# Patient Record
Sex: Female | Born: 1980 | Hispanic: No | Marital: Married | Smoking: Former smoker
Health system: Southern US, Community
[De-identification: ages and names within clinical notes are randomized; demographics above are authoritative.]

## PROBLEM LIST (undated history)

## (undated) DIAGNOSIS — T7840XA Allergy, unspecified, initial encounter: Secondary | ICD-10-CM

## (undated) DIAGNOSIS — E0590 Thyrotoxicosis, unspecified without thyrotoxic crisis or storm: Secondary | ICD-10-CM

## (undated) DIAGNOSIS — E079 Disorder of thyroid, unspecified: Secondary | ICD-10-CM

## (undated) HISTORY — DX: Allergy, unspecified, initial encounter: T78.40XA

## (undated) HISTORY — PX: TUBAL LIGATION: SHX77

## (undated) HISTORY — DX: Disorder of thyroid, unspecified: E07.9

---

## 2018-03-22 ENCOUNTER — Encounter: Payer: Self-pay | Admitting: Family Medicine

## 2018-03-22 ENCOUNTER — Ambulatory Visit (INDEPENDENT_AMBULATORY_CARE_PROVIDER_SITE_OTHER): Payer: Medicaid Other | Admitting: Family Medicine

## 2018-03-22 VITALS — BP 122/83 | HR 89 | Resp 17 | Ht 61.0 in | Wt 196.0 lb

## 2018-03-22 DIAGNOSIS — G43109 Migraine with aura, not intractable, without status migrainosus: Secondary | ICD-10-CM | POA: Diagnosis not present

## 2018-03-22 DIAGNOSIS — Z01818 Encounter for other preprocedural examination: Secondary | ICD-10-CM

## 2018-03-22 DIAGNOSIS — Z7689 Persons encountering health services in other specified circumstances: Secondary | ICD-10-CM

## 2018-03-22 DIAGNOSIS — Z9889 Other specified postprocedural states: Secondary | ICD-10-CM | POA: Diagnosis not present

## 2018-03-22 DIAGNOSIS — H9211 Otorrhea, right ear: Secondary | ICD-10-CM | POA: Diagnosis not present

## 2018-03-22 DIAGNOSIS — H9191 Unspecified hearing loss, right ear: Secondary | ICD-10-CM | POA: Diagnosis not present

## 2018-03-22 DIAGNOSIS — J302 Other seasonal allergic rhinitis: Secondary | ICD-10-CM

## 2018-03-22 MED ORDER — MONTELUKAST SODIUM 10 MG PO TABS: 10.0000 mg | ORAL_TABLET | Freq: Every day | ORAL | 3 refills | Status: DC

## 2018-03-22 MED ORDER — BUTALBITAL-APAP-CAFFEINE 50-325-40 MG PO TABS: 1.0000 | ORAL_TABLET | Freq: Four times a day (QID) | ORAL | 1 refills | Status: DC | PRN

## 2018-03-22 MED ORDER — FLUTICASONE PROPIONATE 50 MCG/ACT NA SUSP: 2.0000 | Freq: Every day | NASAL | 6 refills | Status: DC

## 2018-03-22 MED ORDER — IPRATROPIUM BROMIDE 0.03 % NA SOLN: 2.0000 | Freq: Two times a day (BID) | NASAL | 0 refills | Status: DC

## 2018-03-22 NOTE — Patient Instructions (Signed)
[facility]!    Penelope CoopLatana Belue was seen by Joaquin CourtsKimberly Harris, FNP today.   Tedd SiasLatana Lackie's primary care provider is Bing NeighborsHarris, Kimberly S, FNP.   For the best care possible, you should try to see Joaquin CourtsKimberly Harris, FNP-C whenever you come to the clinic.   We look forward to seeing you again soon!  If you have any questions about your visit today, please call us at [phone] or feel free to reach your primary care provider via MyChart.

## 2018-03-22 NOTE — Progress Notes (Signed)
Sandy Berger, is a 37 y.o. female  ZOX:[id number]  [id number]:[id number]  DOB - Jun 27, 1980  CC:  Chief Complaint  Patient presents with  . Establish Care  . Allergic Rhinitis     has been taking OTC medications with no relief.       HPI: Channie is a 37 y.o. female is here today to establish care.   Wilhemenia Camba does not have a problem list on file.    Today's visit:  Jolanda Mccann recently relocated to [facility]. She is status post delivery of healthy son in October 2018. She reports a concern as she feels the tubal ligation she requested was not performed. She is uncertain if tubal ligation was completed or if her "tubes were removed".  She reports that she was "knocked out"  and is uncertain of what procedure was performed. She has not had any medical follow-up since that time due to loss of medicaid .  Reports regular normal periods. No pain with intercourse or chronic lower pelvic pain.   Allergies/ENT Referral History of chronic allergies. Previously prescribed allergies medications, uncertain of which medications. At one point, she has received allergy injection. No history of asthma. She has had chronic ear problems consisting of chronic draining, ear congestion, and tenderness. She also endorse poor hearing quality.    Headaches  Chronic migraines for years. No prior medication.Number of headaches per week 10 and at least daily. She takes 4 ibuprofen reduces the headache although doesn't completely take headaches. Denies associated visual disturbances. Endorses an aura in which she can differentiate regular mild headaches from that of a an migraine type headache. Endorse nausea at time when headaches are severe. Chronic headaches present since later adolescence. She is not currently prescribed any chronic headache management medications.    Patient denies chest pain, abdominal pain, nausea, new weakness , numbness or tingling, SOB, edema, or worrisome cough.    Current  medications: Current Outpatient Medications:  .  butalbital-acetaminophen-caffeine (FIORICET, ESGIC) 50-325-40 MG tablet, Take 1 tablet by mouth every 6 (six) hours as needed for headache or migraine., Disp: 60 tablet, Rfl: 1 .  fluticasone (FLONASE) 50 MCG/ACT nasal spray, Place 2 sprays into both nostrils daily., Disp: 16 g, Rfl: 6 .  ipratropium (ATROVENT) 0.03 % nasal spray, Place 2 sprays into both nostrils 2 (two) times daily., Disp: 30 mL, Rfl: 0 .  montelukast (SINGULAIR) 10 MG tablet, Take 1 tablet (10 mg total) by mouth at bedtime., Disp: 30 tablet, Rfl: 3   Pertinent family medical history: family history includes Hyperlipidemia in her mother; Hypertension in her paternal uncle.   No Known Allergies  Social History   Socioeconomic History  . Marital status: Married    Spouse name: Not on file  . Number of children: Not on file  . Years of education: Not on file  . Highest education level: Not on file  Occupational History  . Not on file  Social Needs  . Financial resource strain: Not on file  . Food insecurity:    Worry: Not on file    Inability: Not on file  . Transportation needs:    Medical: Not on file    Non-medical: Not on file  Tobacco Use  . Smoking status: Former Games developer  . Smokeless tobacco: Never Used  Substance and Sexual Activity  . Alcohol use: Yes  . Drug use: Never  . Sexual activity: Not on file  Lifestyle  . Physical activity:    Days per week: Not on  file    Minutes per session: Not on file  . Stress: Not on file  Relationships  . Social connections:    Talks on phone: Not on file    Gets together: Not on file    Attends religious service: Not on file    Active member of club or organization: Not on file    Attends meetings of clubs or organizations: Not on file    Relationship status: Not on file  . Intimate partner violence:    Fear of current or ex partner: Not on file    Emotionally abused: Not on file    Physically abused: Not on  file    Forced sexual activity: Not on file  Other Topics Concern  . Not on file  Social History Narrative  . Not on file    Review of Systems: Pertinent negatives listed in HPI Objective:   Vitals:   03/22/18 1453  BP: 122/83  Pulse: 89  Resp: 17  SpO2: 98%    BP Readings from Last 3 Encounters:  03/22/18 122/83    Filed Weights   03/22/18 1453  Weight: 196 lb (88.9 kg)      Physical Exam: Constitutional: Patient appears well-developed and well-nourished. No distress. HENT: Normocephalic, atraumatic, External right and left ear normal. Oropharynx is clear and moist.  Eyes: Conjunctivae and EOM are normal. PERRLA, no scleral icterus. Neck: Normal ROM. Neck supple. No JVD. No tracheal deviation. No thyromegaly. CVS: RRR, S1/S2 +, no murmurs, no gallops, no carotid bruit.  Pulmonary: Effort and breath sounds normal, no stridor, rhonchi, wheezes, rales.  Abdominal: Soft. BS +, no distension, tenderness, rebound or guarding.  Musculoskeletal: Normal range of motion. No edema and no tenderness.  Neuro: Alert. Normal muscle tone coordination. Normal gait. BUE and BLE strength 5/5. Bilateral hand grips symmetrical. No cranial nerve deficit. Skin: Skin is warm and dry. No rash noted. Not diaphoretic. No erythema. No pallor. Psychiatric: Normal mood and affect. Behavior, judgment, thought content normal.     Assessment and plan:  1. Encounter to establish care 2. Drainage from right ear - Ambulatory referral to ENT  3. Hx of external ear surgery - Ambulatory referral to ENT  4. Hearing loss of right ear, unspecified hearing loss type - Ambulatory referral to ENT  5. Tubal ligation evaluation -ordered Non-OB pelvic and transvaginal to evaluate  status  6. Migraine with aura and without status migrainosus, not intractable -trial Fioricet every 6 hours as needed at the initial onset of headache -will consider triptan therapy if HA remain uncontrolled    7. Seasonal  allergies Trial Singulair once daily  Atrovent nasal spray BID PRN nasal congestion Flonase nasal spray once daily     Orders Placed This Encounter  Procedures  . Ambulatory referral to ENT    Referral Priority:   Routine    Referral Type:   Consultation    Referral Reason:   Specialty Services Required    Requested Specialty:   Otolaryngology    Number of Visits Requested:   1    Meds ordered this encounter  Medications  . montelukast (SINGULAIR) 10 MG tablet    Sig: Take 1 tablet (10 mg total) by mouth at bedtime.    Dispense:  30 tablet    Refill:  3  . ipratropium (ATROVENT) 0.03 % nasal spray    Sig: Place 2 sprays into both nostrils 2 (two) times daily.    Dispense:  30 mL    Refill:  0  . fluticasone (FLONASE) 50 MCG/ACT nasal spray    Sig: Place 2 sprays into both nostrils daily.    Dispense:  16 g    Refill:  6  . DISCONTD: butalbital-acetaminophen-caffeine (FIORICET, ESGIC) 50-325-40 MG tablet    Sig: Take 1 tablet by mouth every 6 (six) hours as needed for headache or migraine.    Dispense:  60 tablet    Refill:  1  . DISCONTD: butalbital-acetaminophen-caffeine (FIORICET, ESGIC) 50-325-40 MG tablet    Sig: Take 1 tablet by mouth every 6 (six) hours as needed for headache or migraine.    Dispense:  60 tablet    Refill:  1  . butalbital-acetaminophen-caffeine (FIORICET, ESGIC) 50-325-40 MG tablet    Sig: Take 1 tablet by mouth every 6 (six) hours as needed for headache or migraine.    Dispense:  60 tablet    Refill:  1    The patient was given clear instructions to go to ER or return to medical center if symptoms don't improve, worsen or new problems develop. The patient verbalized understanding. The patient was advised  to call and obtain lab results if they haven't heard anything from out office within 7-10 business days.  Joaquin CourtsKimberly Timber Marshman, FNP [facility] [address], [place] [id number] [phone]: [phone]     This note has been created with Dragon speech recognition software and Paediatric nursesmart phrase technology. Any transcriptional errors are unintentional.

## 2018-04-02 ENCOUNTER — Other Ambulatory Visit: Payer: Self-pay | Admitting: Family Medicine

## 2018-04-08 ENCOUNTER — Telehealth: Payer: Self-pay | Admitting: Family Medicine

## 2018-04-08 ENCOUNTER — Ambulatory Visit (HOSPITAL_COMMUNITY): Admission: RE | Admit: 2018-04-08 | Payer: Medicaid Other | Source: Ambulatory Visit

## 2018-04-08 NOTE — Telephone Encounter (Signed)
Advise after speaking with ultrasound tech, the ultrasound will not provide her with the information she was requesting regarding tubal and lower abdomen symptoms she was experiencing. I think a better idea is for her to see an GYN to determine if another procedure is indicate. Please advise if she would like referral. US has been cancelled.

## 2018-04-08 NOTE — Addendum Note (Signed)
Addended by: Bing Neighbors on: 04/08/2018 12:24 PM   Modules accepted: Orders

## 2018-04-08 NOTE — Telephone Encounter (Signed)
Patient returned call about ultrasound. She would like to proceed with OBGYN referral.

## 2018-04-14 ENCOUNTER — Ambulatory Visit (INDEPENDENT_AMBULATORY_CARE_PROVIDER_SITE_OTHER): Payer: Medicaid Other | Admitting: Family Medicine

## 2018-04-14 ENCOUNTER — Encounter: Payer: Self-pay | Admitting: Family Medicine

## 2018-04-14 VITALS — BP 109/74 | HR 74 | Resp 17 | Ht 61.0 in | Wt 198.4 lb

## 2018-04-14 DIAGNOSIS — Z1389 Encounter for screening for other disorder: Secondary | ICD-10-CM

## 2018-04-14 DIAGNOSIS — Z23 Encounter for immunization: Secondary | ICD-10-CM | POA: Diagnosis not present

## 2018-04-14 DIAGNOSIS — Z13220 Encounter for screening for lipoid disorders: Secondary | ICD-10-CM | POA: Diagnosis not present

## 2018-04-14 DIAGNOSIS — E0590 Thyrotoxicosis, unspecified without thyrotoxic crisis or storm: Secondary | ICD-10-CM | POA: Diagnosis not present

## 2018-04-14 DIAGNOSIS — Z3202 Encounter for pregnancy test, result negative: Secondary | ICD-10-CM

## 2018-04-14 DIAGNOSIS — Z0001 Encounter for general adult medical examination with abnormal findings: Secondary | ICD-10-CM

## 2018-04-14 DIAGNOSIS — IMO0001 Reserved for inherently not codable concepts without codable children: Secondary | ICD-10-CM

## 2018-04-14 DIAGNOSIS — Z0000 Encounter for general adult medical examination without abnormal findings: Secondary | ICD-10-CM

## 2018-04-14 DIAGNOSIS — Z130 Encounter for screening for diseases of the blood and blood-forming organs and certain disorders involving the immune mechanism: Secondary | ICD-10-CM

## 2018-04-14 DIAGNOSIS — Z1329 Encounter for screening for other suspected endocrine disorder: Secondary | ICD-10-CM | POA: Diagnosis not present

## 2018-04-14 DIAGNOSIS — Z114 Encounter for screening for human immunodeficiency virus [HIV]: Secondary | ICD-10-CM

## 2018-04-14 DIAGNOSIS — Z131 Encounter for screening for diabetes mellitus: Secondary | ICD-10-CM

## 2018-04-14 DIAGNOSIS — Z803 Family history of malignant neoplasm of breast: Secondary | ICD-10-CM

## 2018-04-14 DIAGNOSIS — F438 Other reactions to severe stress: Secondary | ICD-10-CM

## 2018-04-14 LAB — POCT URINALYSIS DIP (CLINITEK)
Bilirubin, UA: NEGATIVE
Glucose, UA: NEGATIVE mg/dL
Ketones, POC UA: NEGATIVE mg/dL
Leukocytes, UA: NEGATIVE
Nitrite, UA: NEGATIVE
PH UA: 6.5 (ref 5.0–8.0)
Spec Grav, UA: 1.025 (ref 1.010–1.025)
Urobilinogen, UA: 0.2 E.U./dL

## 2018-04-14 LAB — POCT URINE PREGNANCY: Preg Test, Ur: NEGATIVE

## 2018-04-14 MED ORDER — SUMATRIPTAN SUCCINATE 50 MG PO TABS: 100.0000 mg | ORAL_TABLET | ORAL | 1 refills | Status: DC | PRN

## 2018-04-14 NOTE — Patient Instructions (Addendum)
ENT referral sent to:  [facility] ph. # [phone]   Keeping You Healthy  Get These Tests 1. Blood Pressure- Have your blood pressure checked once a year by your health care provider.  Normal blood pressure is 120/80. 2. Weight- Have your body mass index (BMI) calculated to screen for obesity.  BMI is measure of body fat based on height and weight.  You can also calculate your own BMI at https://www.west-esparza.com/. 3. Cholesterol- Have your cholesterol checked every 5 years starting at age 19 then yearly starting at age 45. 4. Chlamydia, HIV, and other sexually transmitted diseases- Get screened every year until age 47, then within three months of each new sexual provider. 5. Pap Test - Every 1-5 years; discuss with your health care provider. 6. Mammogram- Every 1-2 years starting at age 70--50  Take these medicines  Calcium with Vitamin D-Your body needs 1200 mg of Calcium each day and [phone] IU of Vitamin D daily.  Your body can only absorb 500 mg of Calcium at a time so Calcium must be taken in 2 or 3 divided doses throughout the day.  Multivitamin with folic acid- Once daily if it is possible for you to become pregnant.  Get these Immunizations  Gardasil-Series of three doses; prevents HPV related illness such as genital warts and cervical cancer.  Menactra-Single dose; prevents meningitis.  Tetanus shot- Every 10 years.  Flu shot-Every year.  Take these steps 1. Do not smoke-Your healthcare provider can help you quit.  For tips on how to quit go to www.smokefree.gov or call 1-800 QUITNOW. 2. Be physically active- Exercise 5 days a week for at least 30 minutes.  If you are not already physically active, start slow and gradually work up to 30 minutes of moderate physical activity.  Examples of moderate activity include walking briskly, dancing, swimming, bicycling, etc. 3. Breast Cancer- A self breast exam every month is important for early  detection of breast cancer.  For more information and instruction on self breast exams, ask your healthcare provider or [email or web address].[email or web address]. 4. Eat a healthy diet- Eat a variety of healthy foods such as fruits, vegetables, whole grains, low fat milk, low fat cheeses, yogurt, lean meats, poultry and fish, beans, nuts, tofu, etc.  For more information go to www. Thenutritionsource.org 5. Drink alcohol in moderation- Limit alcohol intake to one drink or less per day. Never drink and drive. 6. Depression- Your emotional health is as important as your physical health.  If you're feeling down or losing interest in things you normally enjoy please talk to your healthcare provider about being screened for depression. 7. Dental visit- Brush and floss your teeth twice daily; visit your dentist twice a year. 8. Eye doctor- Get an eye exam at least every 2 years. 9. Helmet use- Always wear a helmet when riding a bicycle, motorcycle, rollerblading or skateboarding. 10. Safe sex- If you may be exposed to sexually transmitted infections, use a condom. 11. Seat belts- Seat belts can save your live; always wear one. 12. Smoke/Carbon Monoxide detectors- These detectors need to be installed on the appropriate level of your home. Replace batteries at least once a year. 13. Skin cancer- When out in the sun please cover up and use sunscreen 15 SPF or higher. 14. Violence- If anyone is threatening or hurting you, please tell your healthcare provider.

## 2018-04-14 NOTE — Progress Notes (Signed)
Patient ID: Sandy Berger, female    DOB: [birth year], 38 y.o.   MRN: [id number]  PCP: Bing Neighbors, FNP  Chief Complaint  Patient presents with  . Annual Exam    Subjective:  HPI Sandy Berger is a 38 y.o. female, nonsmoker presents for complete physical exam.  Current Body mass index is 37.49 kg/m., inactive of routine physical exercise. She is starting to read food labels more to try and reduce unintentionally gaining weight.  Family history as noted below.  She is concerned today about ongoing headaches.  She is a chronic migraine suffer and notes an increased level of stress recently. Currently residing in the house for 21 other people.  She recently moved from [place] along with all of her family to relocate here to [place].  She is waiting for additional housing.  She is currently unemployed along with her husband who is also unemployed.  She attributes this for the increased frequency of her headaches that was previously well controlled. She is currently taking Fioricet but having to dose it daily and feels she needs something stronger to manage her headaches.  She is also interested in pursuing counseling services as she has some things "I need to work through". She endorses some trouble within her marriage that has been longstanding. Concerns over her children and issues from childhood that she like to discuss. No prior mental health history.    Health Promotion: Health Screening Current/Overdue:   Immunizations- request influenza today PAP- following up with GYN  Mammogram, sister has breast cancer at age 86 year old. Would like a mammogram now. Sister 58 is currently being evaluated for abnormal breast lumps  Colonscopy or Cologuard Last Dental Exam: 1 year ago Last Eye Exam: wears reading, no recent eye exam.  Chronic conditions include: There are no active problems to display for this patient.     Current home medications include: Prior to Admission  medications   Medication Sig Start Date End Date Taking? Authorizing Provider  butalbital-acetaminophen-caffeine (FIORICET, ESGIC) 50-325-40 MG tablet Take 1 tablet by mouth every 6 (six) hours as needed for headache or migraine. 03/22/18 03/22/19 Yes Bing Neighbors, FNP  fluticasone (FLONASE) 50 MCG/ACT nasal spray Place 2 sprays into both nostrils daily. 03/22/18  Yes Bing Neighbors, FNP  ipratropium (ATROVENT) 0.03 % nasal spray Place 2 sprays into both nostrils 2 (two) times daily. 03/22/18  Yes Bing Neighbors, FNP  montelukast (SINGULAIR) 10 MG tablet Take 1 tablet (10 mg total) by mouth at bedtime. 03/22/18  Yes Bing Neighbors, FNP    Family History  Problem Relation Age of Onset  . Hyperlipidemia Mother   . Hypertension Paternal Uncle   . Diabetes Neg Hx   . Stroke Neg Hx      No Known Allergies  Social History   Socioeconomic History  . Marital status: Married    Spouse name: Not on file  . Number of children: Not on file  . Years of education: Not on file  . Highest education level: Not on file  Occupational History  . Not on file  Social Needs  . Financial resource strain: Not on file  . Food insecurity:    Worry: Not on file    Inability: Not on file  . Transportation needs:    Medical: Not on file    Non-medical: Not on file  Tobacco Use  . Smoking status: Former Games developer  . Smokeless tobacco: Never Used  Substance and Sexual  Activity  . Alcohol use: Yes  . Drug use: Never  . Sexual activity: Not on file  Lifestyle  . Physical activity:    Days per week: Not on file    Minutes per session: Not on file  . Stress: Not on file  Relationships  . Social connections:    Talks on phone: Not on file    Gets together: Not on file    Attends religious service: Not on file    Active member of club or organization: Not on file    Attends meetings of clubs or organizations: Not on file    Relationship status: Not on file  . Intimate partner  violence:    Fear of current or ex partner: Not on file    Emotionally abused: Not on file    Physically abused: Not on file    Forced sexual activity: Not on file  Other Topics Concern  . Not on file  Social History Narrative  . Not on file   Review of Systems Pertinent negatives listed in HPI Past Medical, Surgical Family and Social History reviewed and updated.  Objective:   Today's Vitals   04/14/18 1047  BP: 109/74  Pulse: 74  Resp: 17  SpO2: 96%  Weight: 198 lb 6.4 oz (90 kg)  Height: 5\' 1"  (1.549 m)    Wt Readings from Last 3 Encounters:  04/14/18 198 lb 6.4 oz (90 kg)  03/22/18 196 lb (88.9 kg)   Physical Exam Physical Exam: Constitutional: Patient appears well-developed and well-nourished. No distress. HENT: Normocephalic, atraumatic, External right and left ear normal. Oropharynx is clear and moist.  Eyes: Conjunctivae and EOM are normal. PERRLA, no scleral icterus. Neck: Normal ROM. Neck supple. No JVD. No tracheal deviation. No thyromegaly. CVS: RRR, S1/S2 +, no murmurs, no gallops, no carotid bruit.  Pulmonary: Effort and breath sounds normal, no stridor, rhonchi, wheezes, rales.  Abdominal: Soft. BS +, no distension, tenderness, rebound or guarding.  Musculoskeletal: Normal range of motion. No edema and no tenderness.  Lymphadenopathy: No lymphadenopathy noted, cervical, inguinal or axillary Neuro: Alert. Normal reflexes, muscle tone coordination. No cranial nerve deficit. Skin: Skin is warm and dry. No rash noted. Not diaphoretic. No erythema. No pallor. Psychiatric: Normal mood and affect. Behavior, judgment, thought content normal. Assessment & Plan:  1. Annual physical exam Age-appropriate anticipatory guidance provided   2. Screening for deficiency anemia - CBC with Differential  3. Lipid screening - Lipid panel  4. Thyroid disorder screen - Thyroid Panel With TSH  5. Diabetes mellitus screening - Comprehensive metabolic panel - Hemoglobin  A1c  6. Screening for blood or protein in urine - POCT urine pregnancy - POCT URINALYSIS DIP (CLINITEK)  7. Stress reaction, mixed disorders -Schedule an appointment with Jenel LucksJasmine Lewis, LCSW   9. Family history of breast cancer - MM Digital Screening; Future

## 2018-04-15 ENCOUNTER — Telehealth: Payer: Self-pay | Admitting: Family Medicine

## 2018-04-15 DIAGNOSIS — R7989 Other specified abnormal findings of blood chemistry: Secondary | ICD-10-CM

## 2018-04-15 LAB — CBC WITH DIFFERENTIAL/PLATELET
BASOS ABS: 0 10*3/uL (ref 0.0–0.2)
Basos: 1 %
EOS (ABSOLUTE): 0.1 10*3/uL (ref 0.0–0.4)
Eos: 3 %
Hematocrit: 39.4 % (ref 34.0–46.6)
Hemoglobin: 13.3 g/dL (ref 11.1–15.9)
Immature Grans (Abs): 0 10*3/uL (ref 0.0–0.1)
Immature Granulocytes: 0 %
Lymphocytes Absolute: 1.8 10*3/uL (ref 0.7–3.1)
Lymphs: 48 %
MCH: 28.3 pg (ref 26.6–33.0)
MCHC: 33.8 g/dL (ref 31.5–35.7)
MCV: 84 fL (ref 79–97)
Monocytes Absolute: 0.4 10*3/uL (ref 0.1–0.9)
Monocytes: 10 %
Neutrophils Absolute: 1.4 10*3/uL (ref 1.4–7.0)
Neutrophils: 38 %
Platelets: 288 10*3/uL (ref 150–450)
RBC: 4.7 x10E6/uL (ref 3.77–5.28)
RDW: 13 % (ref 11.7–15.4)
WBC: 3.7 10*3/uL (ref 3.4–10.8)

## 2018-04-15 LAB — COMPREHENSIVE METABOLIC PANEL
ALT: 12 IU/L (ref 0–32)
AST: 13 IU/L (ref 0–40)
Albumin/Globulin Ratio: 1.2 (ref 1.2–2.2)
Albumin: 4.2 g/dL (ref 3.8–4.8)
Alkaline Phosphatase: 113 IU/L (ref 39–117)
BUN/Creatinine Ratio: 15 (ref 9–23)
BUN: 10 mg/dL (ref 6–20)
Bilirubin Total: 0.3 mg/dL (ref 0.0–1.2)
CHLORIDE: 103 mmol/L (ref 96–106)
CO2: 21 mmol/L (ref 20–29)
Calcium: 9.2 mg/dL (ref 8.7–10.2)
Creatinine, Ser: 0.65 mg/dL (ref 0.57–1.00)
GFR calc Af Amer: 131 mL/min/{1.73_m2} (ref >59)
GFR calc non Af Amer: 114 mL/min/{1.73_m2} (ref >59)
Globulin, Total: 3.5 g/dL (ref 1.5–4.5)
Glucose: 91 mg/dL (ref 65–99)
Potassium: 4.2 mmol/L (ref 3.5–5.2)
Sodium: 139 mmol/L (ref 134–144)
Total Protein: 7.7 g/dL (ref 6.0–8.5)

## 2018-04-15 LAB — LIPID PANEL
CHOL/HDL RATIO: 3.9 ratio (ref 0.0–4.4)
Cholesterol, Total: 171 mg/dL (ref 100–199)
HDL: 44 mg/dL (ref >39)
LDL Calculated: 86 mg/dL (ref 0–99)
TRIGLYCERIDES: 203 mg/dL — AB (ref 0–149)
VLDL Cholesterol Cal: 41 mg/dL — ABNORMAL HIGH (ref 5–40)

## 2018-04-15 LAB — THYROID PANEL WITH TSH
Free Thyroxine Index: 1.9 (ref 1.2–4.9)
T3 Uptake Ratio: 24 % (ref 24–39)
T4, Total: 7.8 ug/dL (ref 4.5–12.0)

## 2018-04-15 LAB — HEMOGLOBIN A1C
Est. average glucose Bld gHb Est-mCnc: 105 mg/dL
Hgb A1c MFr Bld: 5.3 % (ref 4.8–5.6)

## 2018-04-15 NOTE — Telephone Encounter (Signed)
Patients recent labs were significant for an overactive thyroid. Other labs were within normal range. I am referring patient to endocrinology for further evaluation.

## 2018-04-16 NOTE — Telephone Encounter (Signed)
Patient notified of lab results & recommendations. Expressed understanding. Aware that she will be contacted by Endocrinology with an appointment.

## 2018-04-16 NOTE — Addendum Note (Signed)
Addended by: Bing Neighbors on: 04/16/2018 09:15 PM   Modules accepted: Orders

## 2018-04-20 ENCOUNTER — Ambulatory Visit (INDEPENDENT_AMBULATORY_CARE_PROVIDER_SITE_OTHER): Payer: Medicaid Other | Admitting: Licensed Clinical Social Worker

## 2018-04-20 DIAGNOSIS — F439 Reaction to severe stress, unspecified: Secondary | ICD-10-CM

## 2018-04-21 NOTE — BH Specialist Note (Signed)
Integrated Behavioral Health Initial Visit  MRN: [id number] Name: Sandy Berger  Number of Integrated Behavioral Health Clinician visits:: 1/6 Session Start time: 3:30 PM  Session End time: 4:30 PM Total time: 1 hour  Type of Service: Integrated Behavioral Health- Individual/Family Interpretor:No. Interpretor Name and Language: N/A   SUBJECTIVE: Sandy Berger is a 38 y.o. female accompanied by self Patient was referred by FNP Tiburcio Pea for stress. Patient reports the following symptoms/concerns: Pt recently relocated to [place] from [place] with her family of seven. They are currently residing with at her sister's residence. Pt reports triggers including marriage, financial strain, and worry about her minor children. Duration of problem: Ongoing; Severity of problem: moderate  OBJECTIVE: Mood: Anxious and Affect: Appropriate Risk of harm to self or others: No plan to harm self or others  LIFE CONTEXT: Family and Social: Pt receives support from family School/Work: Pt will be starting a full time position in the next week. She has medicaid Self-Care: Pt is open to initiating psychotherapy to learn healthy coping skills Life Changes: Pt recently relocated to [place] from [place] with her family of seven. They are currently residing with at her sister's residence. Pt reports triggers including marriage, financial strain, and worry about her minor children.  GOALS ADDRESSED: Patient will: 1. Reduce symptoms of: anxiety and stress 2. Increase knowledge and/or ability of: coping skills  3. Demonstrate ability to: Increase healthy adjustment to current life circumstances and Increase adequate support systems for patient/family  INTERVENTIONS: Interventions utilized: Mindfulness or Management consultant, Supportive Counseling, Psychoeducation and/or Health Education and Link to [employer]  Standardized Assessments completed: GAD-7 and PHQ 2&9  ASSESSMENT: Patient  currently experiencing an increase in stress. Pt recently relocated to [place] from [place] with her family of seven. They are currently residing with at her sister's residence. Pt reports triggers including marriage, financial strain, and worry about her minor children.   Patient may benefit from psychotherapy, which patient is open to initiating. Patient has good insight on how her psychosocial stressors have negatively impacted her mental and physical health. Healthy coping skills were discussed to assist with decreasing and/or managing symptoms. Behavioral health resources that coincide with pt's work schedule were mailed out to patient's residence.   PLAN: 1. Follow up with behavioral health clinician on : Pt was encouraged to contact [facility] if symptoms worsen or fail to improve to schedule behavioral appointments at [facility]. 2. Behavioral recommendations: LCSWA recommends that pt apply healthy coping skills discussed and utilize provided resources. Pt is encouraged to schedule follow up appointment with [facility]WA 3. Referral(s): Integrated Art gallery manager (In Clinic) and Community Mental Health Services (LME/Outside Clinic) 4. "From scale of 1-10, how likely are you to follow plan?":   Bridgett Larsson, LCSW 04/23/2018 10:47 AM

## 2018-04-27 DIAGNOSIS — E0590 Thyrotoxicosis, unspecified without thyrotoxic crisis or storm: Secondary | ICD-10-CM | POA: Diagnosis not present

## 2018-06-04 ENCOUNTER — Other Ambulatory Visit: Payer: Self-pay

## 2018-06-04 ENCOUNTER — Ambulatory Visit
Admission: RE | Admit: 2018-06-04 | Discharge: 2018-06-04 | Disposition: A | Discharge status: Home / Self Care | Payer: Medicaid Other | Source: Ambulatory Visit | Attending: Family Medicine | Admitting: Family Medicine

## 2018-06-04 DIAGNOSIS — Z803 Family history of malignant neoplasm of breast: Secondary | ICD-10-CM

## 2018-06-04 DIAGNOSIS — Z1231 Encounter for screening mammogram for malignant neoplasm of breast: Secondary | ICD-10-CM | POA: Diagnosis not present

## 2018-06-09 ENCOUNTER — Ambulatory Visit (INDEPENDENT_AMBULATORY_CARE_PROVIDER_SITE_OTHER): Payer: Medicaid Other | Admitting: Family Medicine

## 2018-06-09 ENCOUNTER — Other Ambulatory Visit (HOSPITAL_COMMUNITY)
Admission: RE | Admit: 2018-06-09 | Discharge: 2018-06-09 | Disposition: A | Discharge status: Home / Self Care | Payer: Medicaid Other | Source: Ambulatory Visit | Attending: Family Medicine | Admitting: Family Medicine

## 2018-06-09 ENCOUNTER — Other Ambulatory Visit: Payer: Self-pay

## 2018-06-09 ENCOUNTER — Encounter: Payer: Self-pay | Admitting: Family Medicine

## 2018-06-09 VITALS — BP 113/72 | HR 82 | Temp 98.4°F | Resp 17 | Ht 61.0 in | Wt 204.8 lb

## 2018-06-09 DIAGNOSIS — R51 Headache: Secondary | ICD-10-CM | POA: Diagnosis not present

## 2018-06-09 DIAGNOSIS — N898 Other specified noninflammatory disorders of vagina: Secondary | ICD-10-CM | POA: Diagnosis not present

## 2018-06-09 DIAGNOSIS — Z7689 Persons encountering health services in other specified circumstances: Secondary | ICD-10-CM

## 2018-06-09 DIAGNOSIS — R519 Headache, unspecified: Secondary | ICD-10-CM

## 2018-06-09 MED ORDER — CETIRIZINE HCL 10 MG PO TABS: 10.0000 mg | ORAL_TABLET | Freq: Every day | ORAL | 11 refills | Status: DC

## 2018-06-09 MED ORDER — PSEUDOEPHEDRINE HCL 60 MG PO TABS: 60.0000 mg | ORAL_TABLET | ORAL | 0 refills | Status: DC | PRN

## 2018-06-09 MED ORDER — PSEUDOEPHEDRINE HCL 60 MG PO TABS: 60.0000 mg | ORAL_TABLET | Freq: Three times a day (TID) | ORAL | 0 refills | Status: DC | PRN

## 2018-06-09 MED ORDER — METRONIDAZOLE 500 MG PO TABS: 500.0000 mg | ORAL_TABLET | Freq: Two times a day (BID) | ORAL | 0 refills | Status: DC

## 2018-06-09 NOTE — Patient Instructions (Addendum)
[facility] ph. # [id number] address [address]  Call to schedule an appointment.  I have referred you to OB/GYN for complete Pap smear.  The ultrasound will not verify your tubal ligation and another test would need to be performed.  I recommend calling and requesting records from the hospital in which you delivered your son and requested the procedure.    Allergies, Adult An allergy means that your body reacts to something that bothers it (allergen). It is not a normal reaction. This can happen from something that you:  Eat.  Breathe in.  Touch. You can have an allergy (be allergic) to:  Outdoor things, like: ? Pollen. ? Grass. ? Weeds.  Indoor things, like: ? Dust. ? Smoke. ? Pet dander.  Foods.  Medicines.  Things that bother your skin, like: ? Detergents. ? Chemicals. ? Latex.  Perfume.  Bugs. An allergy cannot spread from person to person (is not contagious). Follow these instructions at home:         Stay away from things that you know you are allergic to.  If you have allergies to things in the air, wash out your nose each day. Do it with one of these: ? A salt-water (saline) spray. ? A container (neti pot).  Take over-the-counter and prescription medicines only as told by your doctor.  Keep all follow-up visits as told by your doctor. This is important.  If you are at risk for a very bad allergy reaction (anaphylaxis), keep an auto-injector with you all the time. This is called an epinephrine injection. ? This is pre-measured medicine with a needle. You can put it into your skin by yourself. ? Right after you have a very bad allergy reaction, you or a person with you must give the medicine in less than a few minutes. This is an emergency.  If you have ever had a very bad allergy reaction, wear a medical alert bracelet or necklace. Your very bad allergy should be written on it. Contact a health care  provider if:  Your symptoms do not get better with treatment. Get help right away if:  You have symptoms of a very bad allergy reaction. These include: ? A swollen mouth, tongue, or throat. ? Pain or tightness in your chest. ? Trouble breathing. ? Being short of breath. ? Dizziness. ? Fainting. ? Very bad pain in your belly (abdomen). ? Throwing up (vomiting). ? Watery poop (diarrhea). Summary  An allergy means that your body reacts to something that bothers it (allergen). It is not a normal reaction.  Stay away from things that make your body react.  Take over-the-counter and prescription medicines only as told by your doctor.  If you are at risk for a very bad allergy reaction, carry an auto-injector (epinephrine injection) all the time. Also, wear a medical alert bracelet or necklace so people know about your allergy. This information is not intended to replace advice given to you by your health care provider. Make sure you discuss any questions you have with your health care provider. Document Released: 07/05/2012 Document Revised: 06/23/2016 Document Reviewed: 06/23/2016 Elsevier Interactive Patient Education  2019 [employer].

## 2018-06-09 NOTE — Progress Notes (Deleted)
Regular headache have improved,

## 2018-06-09 NOTE — Progress Notes (Signed)
Patient ID: Sandy Berger, female    DOB: April 08, 1980, 38 y.o.   MRN: [id number]  PCP: Bing Neighbors, FNP  Chief Complaint  Patient presents with  . Headache    Subjective:  HPI  Sandy Berger is a 38 y.o. female presents for a headache follow-up. Headaches have improved.  Patient has had only approximately 3 headaches since she was last seen in office.  With one headache she did attempt to take the Imitrex and had severe drowsiness therefore has not taken the medication again. The Fioricet has helped eliminate other headaches. She has a complaint today of left-sided facial pressure and pain. She endorses allergies. She attempted to use Atrovent and Flonase which were prescribed however reports the next day she felt worse. She has not taken any over-the-counter antihistamines for her symptoms. She reports the regular headache medicine does not improve the facial pressure symptoms. Patient was also recently diagnosed with hyperthyroidism.  She recently started tapazole and reports this is caused her energy level to go down.  She reports prior to start medication she had excessive amount of energy and feels that the medication is somewhat slowed her down. She is  followed by endocrinologist  Social History   Socioeconomic History  . Marital status: Married    Spouse name: Not on file  . Number of children: Not on file  . Years of education: Not on file  . Highest education level: Not on file  Occupational History  . Not on file  Social Needs  . Financial resource strain: Not on file  . Food insecurity:    Worry: Not on file    Inability: Not on file  . Transportation needs:    Medical: Not on file    Non-medical: Not on file  Tobacco Use  . Smoking status: Former Games developer  . Smokeless tobacco: Never Used  Substance and Sexual Activity  . Alcohol use: Yes  . Drug use: Never  . Sexual activity: Not on file  Lifestyle  . Physical activity:    Days per week: Not on  file    Minutes per session: Not on file  . Stress: Not on file  Relationships  . Social connections:    Talks on phone: Not on file    Gets together: Not on file    Attends religious service: Not on file    Active member of club or organization: Not on file    Attends meetings of clubs or organizations: Not on file    Relationship status: Not on file  . Intimate partner violence:    Fear of current or ex partner: Not on file    Emotionally abused: Not on file    Physically abused: Not on file    Forced sexual activity: Not on file  Other Topics Concern  . Not on file  Social History Narrative  . Not on file    Family History  Problem Relation Age of Onset  . Hyperlipidemia Mother   . Hypertension Paternal Uncle   . Breast cancer Sister   . Diabetes Neg Hx   . Stroke Neg Hx      Review of Systems Pertinent negatives listed in HPI No Known Allergies  Prior to Admission medications   Medication Sig Start Date End Date Taking? Authorizing Provider  butalbital-acetaminophen-caffeine (FIORICET, ESGIC) 50-325-40 MG tablet Take 1 tablet by mouth every 6 (six) hours as needed for headache or migraine. 03/22/18 03/22/19 Yes Bing Neighbors, FNP  fluticasone (  FLONASE) 50 MCG/ACT nasal spray Place 2 sprays into both nostrils daily. 03/22/18  Yes Bing Neighbors, FNP  ipratropium (ATROVENT) 0.03 % nasal spray Place 2 sprays into both nostrils 2 (two) times daily. 03/22/18  Yes Bing Neighbors, FNP  methimazole (TAPAZOLE) 5 MG tablet Take 5 mg by mouth daily. 05/27/18  Yes [provider]  montelukast (SINGULAIR) 10 MG tablet Take 1 tablet (10 mg total) by mouth at bedtime. 03/22/18  Yes Bing Neighbors, FNP  SUMAtriptan (IMITREX) 50 MG tablet Take 2 tablets (100 mg total) by mouth every 2 (two) hours as needed for migraine. May repeat in 2 hours if headache persists or recurs. 04/14/18  Yes Bing Neighbors, FNP    Past Medical, Surgical Family and Social History  reviewed and updated.    Objective:   Today's Vitals   06/09/18 1031  BP: 113/72  Pulse: 82  Resp: 17  Temp: 98.4 F (36.9 C)  TempSrc: Oral  SpO2: 97%  Weight: 204 lb 12.8 oz (92.9 kg)  Height: 5\' 1"  (1.549 m)    BP Readings from Last 3 Encounters:  06/09/18 113/72  04/14/18 109/74  03/22/18 122/83    Filed Weights   06/09/18 1031  Weight: 204 lb 12.8 oz (92.9 kg)       Physical Exam Constitutional:      Appearance: Normal appearance.  HENT:     Head: Normocephalic.     Nose: Nasal tenderness, mucosal edema, congestion and rhinorrhea present.     Left Sinus: Maxillary sinus tenderness present.  Neck:     Musculoskeletal: Normal range of motion and neck supple.  Cardiovascular:     Rate and Rhythm: Normal rate and regular rhythm.  Musculoskeletal: Normal range of motion.  Lymphadenopathy:     Cervical: No cervical adenopathy.  Skin:    General: Skin is warm and dry.  Neurological:     General: No focal deficit present.     Mental Status: She is alert.      No results found for: POCGLU  Lab Results  Component Value Date   HGBA1C 5.3 04/14/2018       Assessment & Plan:  1. Left-sided headache, secondary nasal congestion Will trial pseudoephedrine 60 mg every 8 hours as needed Continue Cetrizine and Singulair Continue Flonase only   2. Referral of patient - Ambulatory referral to Obstetrics / Gynecology ,Gynecological exam   3. Vaginal discharge, hx of BV Will start metronidazole 500 mg BID while vaginal specimen is pending.  - Cervicovaginal ancillary only, pending   Joaquin Courts, FNP Primary Care at [facility] [address] [place] [address] [phone]: [phone]

## 2018-06-10 LAB — CERVICOVAGINAL ANCILLARY ONLY
Bacterial vaginitis: POSITIVE — AB
CHLAMYDIA, DNA PROBE: NEGATIVE
Candida vaginitis: NEGATIVE
NEISSERIA GONORRHEA: NEGATIVE
Trichomonas: NEGATIVE

## 2018-07-12 DIAGNOSIS — E0590 Thyrotoxicosis, unspecified without thyrotoxic crisis or storm: Secondary | ICD-10-CM | POA: Diagnosis not present

## 2018-07-19 DIAGNOSIS — E0500 Thyrotoxicosis with diffuse goiter without thyrotoxic crisis or storm: Secondary | ICD-10-CM | POA: Diagnosis not present

## 2018-07-26 ENCOUNTER — Ambulatory Visit (INDEPENDENT_AMBULATORY_CARE_PROVIDER_SITE_OTHER): Payer: Medicaid Other | Admitting: Family Medicine

## 2018-07-26 ENCOUNTER — Other Ambulatory Visit: Payer: Self-pay

## 2018-07-26 DIAGNOSIS — R51 Headache: Secondary | ICD-10-CM | POA: Diagnosis not present

## 2018-07-26 DIAGNOSIS — K089 Disorder of teeth and supporting structures, unspecified: Secondary | ICD-10-CM | POA: Diagnosis not present

## 2018-07-26 DIAGNOSIS — R519 Headache, unspecified: Secondary | ICD-10-CM

## 2018-07-26 MED ORDER — NAPROXEN 500 MG PO TABS: 500.0000 mg | ORAL_TABLET | Freq: Two times a day (BID) | ORAL | 0 refills | Status: DC

## 2018-07-26 MED ORDER — AMOXICILLIN 500 MG PO CAPS: 500.0000 mg | ORAL_CAPSULE | Freq: Two times a day (BID) | ORAL | 0 refills | Status: DC

## 2018-07-26 NOTE — Progress Notes (Signed)
Virtual Visit via Telephone Note  I connected with Sandy Berger on 07/26/18 at  3:50 PM EDT by telephone and verified that I am speaking with the correct person using two identifiers.  Location: Patient: Patient is located at home during today's encounter Provider: Located at primary care office    I discussed the limitations, risks, security and privacy concerns of performing an evaluation and management service by telephone and the availability of in person appointments. I also discussed with the patient that there may be a patient responsible charge related to this service. The patient expressed understanding and agreed to proceed.   History of Present Illness: Concern for possible dental infection. Experiencing right maxillary facial pain. She has tooth on the right side needing to be extracted. Due to COVID-19 restrictions, dental office is currently closed. Pain is radiating in the right ear. No erythema or noticeable facial swelling of the right-side of face.   No inner ear pain, pressure, popping, or drainage. No recent sinus or nasal symptoms. She has taking multiple doses of 800 mg ibuprofen and Tylenol without improvement of pain. No fever, chills, nausea, or vomiting.   Assessment and Plan: 1. Right sided facial pain 2. Poor dentition Suspect dental problem as the source of facial tenderness given recent evaluation by dentist recommended tooth extraction. Will cover with Amoxicillin 500 mg BID x 10 days.  Follow Up Instructions: RTC if symptoms worsen or do not improve.   I discussed the assessment and treatment plan with the patient. The patient was provided an opportunity to ask questions and all were answered. The patient agreed with the plan and demonstrated an understanding of the instructions.   The patient was advised to call back or seek an in-person evaluation if the symptoms worsen or if the condition fails to improve as anticipated.  I provided 15 minutes of  non-face-to-face time during this encounter.   Joaquin Courts, FNP

## 2018-07-26 NOTE — Progress Notes (Signed)
Called patient to initiate their telephone visit with provider Joaquin Courts, FNP-C. Verified date of birth. Patient has c/o of R sided dental pain x 2 weeks. Has been alternating Tylenol & Ibuprofen with mild relief. KWalker, CMA.

## 2018-07-30 ENCOUNTER — Ambulatory Visit: Payer: Medicaid Other | Admitting: Family Medicine

## 2018-09-29 ENCOUNTER — Telehealth: Payer: Self-pay | Admitting: Obstetrics & Gynecology

## 2018-09-29 NOTE — Telephone Encounter (Signed)
Called the patient to inform of upcoming appointment. Left a detailed voicemail of the location, appointment time and date. Also informed the patient if they’ve been in close contact with someone or diagnosed with Covid19 in the previous 14-day period please call to reschedule. If the patient has also experience flu-like symptoms such as sore throat, fever, and/or shortness of breath. Please call our office to reschedule. In addition, please be sure to wear a face mask to the visit and sanitize hands upon entering the office. Also, no children or visitors or allowed due to Covid19 restrictions. If you have any questions or concerns, please contact our office. °

## 2018-09-30 ENCOUNTER — Encounter: Payer: Self-pay | Admitting: Family Medicine

## 2018-09-30 ENCOUNTER — Other Ambulatory Visit: Payer: Self-pay

## 2018-09-30 ENCOUNTER — Other Ambulatory Visit (HOSPITAL_COMMUNITY)
Admission: RE | Admit: 2018-09-30 | Discharge: 2018-09-30 | Disposition: A | Discharge status: Home / Self Care | Payer: Medicaid Other | Source: Ambulatory Visit | Attending: Family Medicine | Admitting: Family Medicine

## 2018-09-30 ENCOUNTER — Ambulatory Visit (INDEPENDENT_AMBULATORY_CARE_PROVIDER_SITE_OTHER): Payer: Medicaid Other | Admitting: Family Medicine

## 2018-09-30 VITALS — Wt 204.0 lb

## 2018-09-30 DIAGNOSIS — Z01419 Encounter for gynecological examination (general) (routine) without abnormal findings: Secondary | ICD-10-CM | POA: Diagnosis not present

## 2018-09-30 DIAGNOSIS — R102 Pelvic and perineal pain unspecified side: Secondary | ICD-10-CM

## 2018-09-30 DIAGNOSIS — N898 Other specified noninflammatory disorders of vagina: Secondary | ICD-10-CM | POA: Diagnosis not present

## 2018-09-30 DIAGNOSIS — Z124 Encounter for screening for malignant neoplasm of cervix: Secondary | ICD-10-CM | POA: Diagnosis not present

## 2018-09-30 NOTE — Progress Notes (Signed)
[place] scheduled for July 23rd @ 1400.  Pt notified.

## 2018-09-30 NOTE — Progress Notes (Signed)
  Subjective:     Sandy Berger is a 38 y.o. P6 with 2 prior C-sections  female and is here for a comprehensive physical exam. The patient reports problems - pelvic pain. Has had BTL after last delivery. Reports pain and is worse with movement. Cycles are ok, but have more pain. Reports I cannot hold my pee, some urge and some stress incontinence.   The following portions of the patient's history were reviewed and updated as appropriate: allergies, current medications, past family history, past medical history, past social history, past surgical history and problem list.  Review of Systems Pertinent items noted in HPI and remainder of comprehensive ROS otherwise negative.   Objective:    Wt 204 lb (92.5 kg)   LMP 09/01/2018 (Exact Date)   BMI 38.55 kg/m  Head: Normocephalic, without obvious abnormality, atraumatic Neck: no adenopathy, supple, symmetrical, trachea midline and thyroid not enlarged, symmetric, no tenderness/mass/nodules Lungs: clear to auscultation bilaterally Breasts: normal appearance, no masses or tenderness Heart: regular rate and rhythm, S1, S2 normal, no murmur, click, rub or gallop Abdomen: soft, non-tender; bowel sounds normal; no masses,  no organomegaly Pelvic: cervix normal in appearance, external genitalia normal, no adnexal masses or tenderness, no cervical motion tenderness, uterus normal size, shape, and consistency and vagina normal without discharge Extremities: extremities normal, atraumatic, no cyanosis or edema Pulses: 2+ and symmetric Skin: Skin color, texture, turgor normal. No rashes or lesions Lymph nodes: Cervical, supraclavicular, and axillary nodes normal. Neurologic: Grossly normal    Assessment:    Healthy female exam.      Plan:   Problem List Items Addressed This Visit      Unprioritized   Vaginal discharge - Primary    New-Check wet prep      Relevant Orders   Cervicovaginal ancillary only( [place])   Pelvic pain    New--unclear etiology. Not cyclical. Check u/s      Relevant Orders   US PELVIS TRANSVAGINAL NON-OB ([facility] ONLY)    Other Visit Diagnoses    Screening for malignant neoplasm of cervix       Relevant Orders   Cytology - PAP( [place])   Encounter for gynecological examination without abnormal finding       Relevant Orders   Cytology - PAP( [place])     Return in 2 months (on 12/01/2018) for a follow-up, virtual.    See After Visit Summary for Counseling Recommendations

## 2018-09-30 NOTE — Assessment & Plan Note (Signed)
New-Check wet prep

## 2018-09-30 NOTE — Patient Instructions (Signed)
Preventive Care 21-39 Years Old, Female Preventive care refers to visits with your health care provider and lifestyle choices that can promote health and wellness. This includes:  A yearly physical exam. This may also be called an annual well check.  Regular dental visits and eye exams.  Immunizations.  Screening for certain conditions.  Healthy lifestyle choices, such as eating a healthy diet, getting regular exercise, not using drugs or products that contain nicotine and tobacco, and limiting alcohol use. What can I expect for my preventive care visit? Physical exam Your health care provider will check your:  Height and weight. This may be used to calculate body mass index (BMI), which tells if you are at a healthy weight.  Heart rate and blood pressure.  Skin for abnormal spots. Counseling Your health care provider may ask you questions about your:  Alcohol, tobacco, and drug use.  Emotional well-being.  Home and relationship well-being.  Sexual activity.  Eating habits.  Work and work environment.  Method of birth control.  Menstrual cycle.  Pregnancy history. What immunizations do I need?  Influenza (flu) vaccine  This is recommended every year. Tetanus, diphtheria, and pertussis (Tdap) vaccine  You may need a Td booster every 10 years. Varicella (chickenpox) vaccine  You may need this if you have not been vaccinated. Human papillomavirus (HPV) vaccine  If recommended by your health care provider, you may need three doses over 6 months. Measles, mumps, and rubella (MMR) vaccine  You may need at least one dose of MMR. You may also need a second dose. Meningococcal conjugate (MenACWY) vaccine  One dose is recommended if you are age 19-21 years and a first-year college student living in a residence hall, or if you have one of several medical conditions. You may also need additional booster doses. Pneumococcal conjugate (PCV13) vaccine  You may need  this if you have certain conditions and were not previously vaccinated. Pneumococcal polysaccharide (PPSV23) vaccine  You may need one or two doses if you smoke cigarettes or if you have certain conditions. Hepatitis A vaccine  You may need this if you have certain conditions or if you travel or work in places where you may be exposed to hepatitis A. Hepatitis B vaccine  You may need this if you have certain conditions or if you travel or work in places where you may be exposed to hepatitis B. Haemophilus influenzae type b (Hib) vaccine  You may need this if you have certain conditions. You may receive vaccines as individual doses or as more than one vaccine together in one shot (combination vaccines). Talk with your health care provider about the risks and benefits of combination vaccines. What tests do I need?  Blood tests  Lipid and cholesterol levels. These may be checked every 5 years starting at age 20.  Hepatitis C test.  Hepatitis B test. Screening  Diabetes screening. This is done by checking your blood sugar (glucose) after you have not eaten for a while (fasting).  Sexually transmitted disease (STD) testing.  BRCA-related cancer screening. This may be done if you have a family history of breast, ovarian, tubal, or peritoneal cancers.  Pelvic exam and Pap test. This may be done every 3 years starting at age 21. Starting at age 30, this may be done every 5 years if you have a Pap test in combination with an HPV test. Talk with your health care provider about your test results, treatment options, and if necessary, the need for more tests.   Follow these instructions at home: Eating and drinking   Eat a diet that includes fresh fruits and vegetables, whole grains, lean protein, and low-fat dairy.  Take vitamin and mineral supplements as recommended by your health care provider.  Do not drink alcohol if: ? Your health care provider tells you not to drink. ? You are  pregnant, may be pregnant, or are planning to become pregnant.  If you drink alcohol: ? Limit how much you have to 0-1 drink a day. ? Be aware of how much alcohol is in your drink. In the [place] one drink equals one 12 oz bottle of beer (355 mL), one 5 oz glass of wine (148 mL), or one 1 oz glass of hard liquor (44 mL). Lifestyle  Take daily care of your teeth and gums.  Stay active. Exercise for at least 30 minutes on 5 or more days each week.  Do not use any products that contain nicotine or tobacco, such as cigarettes, e-cigarettes, and chewing tobacco. If you need help quitting, ask your health care provider.  If you are sexually active, practice safe sex. Use a condom or other form of birth control (contraception) in order to prevent pregnancy and STIs (sexually transmitted infections). If you plan to become pregnant, see your health care provider for a preconception visit. What's next?  Visit your health care provider once a year for a well check visit.  Ask your health care provider how often you should have your eyes and teeth checked.  Stay up to date on all vaccines. This information is not intended to replace advice given to you by your health care provider. Make sure you discuss any questions you have with your health care provider. Document Released: 05/06/2001 Document Revised: 11/19/2017 Document Reviewed: 11/19/2017 Elsevier Patient Education  2020 [employer]  

## 2018-09-30 NOTE — Assessment & Plan Note (Signed)
New--unclear etiology. Not cyclical. Check u/s

## 2018-10-01 LAB — CERVICOVAGINAL ANCILLARY ONLY
Bacterial vaginitis: NEGATIVE
Candida vaginitis: NEGATIVE
Trichomonas: NEGATIVE

## 2018-10-04 ENCOUNTER — Telehealth: Payer: Self-pay | Admitting: Lactation Services

## 2018-10-04 NOTE — Telephone Encounter (Signed)
-----   Message from Donnamae Jude, MD sent at 10/04/2018  8:31 AM EDT ----- Her wet prep is negative.

## 2018-10-04 NOTE — Telephone Encounter (Signed)
Attempted to call with pt to give her results of her Wet Prep. No answer. LM for pt to call the office to obtain her results.

## 2018-10-05 ENCOUNTER — Telehealth: Payer: Self-pay | Admitting: General Practice

## 2018-10-05 LAB — CYTOLOGY - PAP
Diagnosis: NEGATIVE
HPV: NOT DETECTED

## 2018-10-05 NOTE — Telephone Encounter (Signed)
Patient called and left message on nurse voicemail line stating she is returning our phone call. Called patient, no answer- left message stating your results from your recent office visit are negative. If you have questions you may call us back.

## 2018-10-05 NOTE — Telephone Encounter (Signed)
Patient called and left message on nurse voicemail line stating she is returning our phone call. Called patient & informed her of negative results. Patient verbalized understanding & had no questions.

## 2018-10-14 ENCOUNTER — Ambulatory Visit (HOSPITAL_COMMUNITY)
Admission: RE | Admit: 2018-10-14 | Discharge: 2018-10-14 | Disposition: A | Discharge status: Home / Self Care | Payer: Medicaid Other | Source: Ambulatory Visit | Attending: Family Medicine | Admitting: Family Medicine

## 2018-10-14 ENCOUNTER — Other Ambulatory Visit: Payer: Self-pay

## 2018-10-14 DIAGNOSIS — R102 Pelvic and perineal pain: Secondary | ICD-10-CM | POA: Insufficient documentation

## 2018-12-14 ENCOUNTER — Telehealth: Payer: Self-pay | Admitting: Obstetrics & Gynecology

## 2018-12-14 NOTE — Telephone Encounter (Signed)
Spoke with patient about her appointment on 9/22 @ 8:55. Patient instructed that the appointment is a mychart visit. Patient instructed to download the mychart app if not already done so. Patient verbalized she has the app downloaded

## 2018-12-15 ENCOUNTER — Other Ambulatory Visit: Payer: Self-pay

## 2018-12-15 ENCOUNTER — Encounter: Payer: Self-pay | Admitting: Obstetrics & Gynecology

## 2018-12-15 ENCOUNTER — Telehealth (INDEPENDENT_AMBULATORY_CARE_PROVIDER_SITE_OTHER): Payer: Medicaid Other | Admitting: Obstetrics & Gynecology

## 2018-12-15 DIAGNOSIS — R32 Unspecified urinary incontinence: Secondary | ICD-10-CM | POA: Diagnosis not present

## 2018-12-15 DIAGNOSIS — R102 Pelvic and perineal pain: Secondary | ICD-10-CM | POA: Diagnosis not present

## 2018-12-15 NOTE — Progress Notes (Signed)
TELEHEALTH GYNECOLOGY VIRTUAL VIDEO VISIT ENCOUNTER NOTE  Provider location: Center for [employer] at [place]   I connected with Sandy Berger on 12/15/18 at  8:55 AM EDT by MyChart Video Encounter at home and verified that I am speaking with the correct person using two identifiers.   I discussed the limitations, risks, security and privacy concerns of performing an evaluation and management service virtually and the availability of in person appointments. I also discussed with the patient that there may be a patient responsible charge related to this service. The patient expressed understanding and agreed to proceed.   History:  Sandy Berger is a 39 y.o. P6 female being followed up today for pelvic pain. Was seen by Dr. Shawnie Pons on 09/30/2018, labs and ultrasound were ordered. She had benign pelvic cultures and pap, normal ultrasound findings.  Pelvic pain has continued, intermittent in nature, not associated with any known factors.  She also reports having continued urinary incontinence issues, describes not being able to hold her urine.  She denies any current abnormal vaginal discharge, bleeding or other concerns.       Past Medical History:  Diagnosis Date  . Allergy    Past Surgical History:  Procedure Laterality Date  . CESAREAN SECTION     The following portions of the patient's history were reviewed and updated as appropriate: allergies, current medications, past family history, past medical history, past social history, past surgical history and problem list.   Health Maintenance:  Normal pap and negative HRHPV on 09/30/2018.   Review of Systems:  Pertinent items noted in HPI and remainder of comprehensive ROS otherwise negative.  Physical Exam:   General:  Alert, oriented and cooperative. Patient appears to be in no acute distress.  Mental Status: Normal mood and affect. Normal behavior. Normal judgment and thought content.   Respiratory: Normal  respiratory effort, no problems with respiration noted  Rest of physical exam deferred due to type of encounter  Labs and Imaging US Pelvis Transvaginal Non-ob (tv Only)  Result Date: 10/14/2018 CLINICAL DATA:  Pelvic pain LEFT greater than RIGHT EXAM: ULTRASOUND PELVIS TRANSVAGINAL TECHNIQUE: Transvaginal ultrasound examination of the pelvis was performed including evaluation of the uterus, ovaries, adnexal regions, and pelvic cul-de-sac. COMPARISON:  None FINDINGS: Uterus Measurements: 9.5 x 4.1 x 5.4 cm = volume: 110 mL. Retroflexed. Fundus and upper uterine segment suboptimally visualized. No definite focal mass Endometrium Thickness: 9 mm, normal. No endometrial fluid or focal abnormality. Small amount of nonspecific endocervical fluid. Right ovary Measurements: 3.4 x 1.9 x 3.0 cm = volume: 10.1 mL. Normal morphology without mass Left ovary Not visualized on either transabdominal or endovaginal imaging, likely obscured by bowel Other findings:  No free pelvic fluid.  No pelvic masses identified. IMPRESSION: Nonvisualization of LEFT ovary. No pelvic sonographic abnormalities identified. Electronically Signed   By: Ulyses Southward M.D.   On: 10/14/2018 15:55   Results for orders placed or performed in visit on 09/30/18 (from the past 2016 hour(s))  Cytology - PAP( [place])   Collection Time: 09/30/18 12:00 AM  Result Value Ref Range   Adequacy      Satisfactory for evaluation  endocervical/transformation zone component PRESENT.   Diagnosis      NEGATIVE FOR INTRAEPITHELIAL LESIONS OR MALIGNANCY.   HPV NOT DETECTED    Material Submitted CervicoVaginal Pap [ThinPrep Imaged]    CYTOLOGY - PAP PAP RESULT   Cervicovaginal ancillary only( [place])   Collection Time: 09/30/18 12:00 AM  Result Value Ref Range   Bacterial vaginitis Negative for Bacterial Vaginitis Microorganisms    Candida vaginitis Negative for Candida species    Trichomonas Negative        Assessment and Plan:     1.  Pelvic pain No clear GYN etiology seen.  Patient told it could be of musculoskeletal, nerve-related, urinary and gastrointestinal etiology. NSAIDs recommended for pain. If worsens, can do referral to other specialists for further evaluation.  2. Urinary incontinence in female Recommended Urogynecology referral, she declined for now due to insurance issues. Will let us know when she wants this.     I discussed the assessment and treatment plan with the patient. The patient was provided an opportunity to ask questions and all were answered. The patient agreed with the plan and demonstrated an understanding of the instructions.   The patient was advised to call back or seek an in-person evaluation/go to the ED if the symptoms worsen or if the condition fails to improve as anticipated.  I provided 15 minutes of face-to-face time during this encounter.   Verita Schneiders, MD Center for [facility], [place]

## 2018-12-15 NOTE — Patient Instructions (Addendum)
Return to clinic for any scheduled appointments or for any gynecologic concerns as needed.    You can contact us when you are ready for the Urogynecology referral to Dr. Maryland Pink ([facility])

## 2018-12-22 ENCOUNTER — Encounter

## 2019-03-28 ENCOUNTER — Ambulatory Visit: Payer: Medicaid Other

## 2019-04-20 ENCOUNTER — Other Ambulatory Visit: Payer: Self-pay | Admitting: Family Medicine

## 2019-04-21 ENCOUNTER — Ambulatory Visit (INDEPENDENT_AMBULATORY_CARE_PROVIDER_SITE_OTHER): Payer: Medicaid Other | Admitting: Family Medicine

## 2019-04-21 ENCOUNTER — Other Ambulatory Visit: Payer: Self-pay

## 2019-04-21 DIAGNOSIS — G43109 Migraine with aura, not intractable, without status migrainosus: Secondary | ICD-10-CM

## 2019-04-21 DIAGNOSIS — T7840XA Allergy, unspecified, initial encounter: Secondary | ICD-10-CM | POA: Insufficient documentation

## 2019-04-21 DIAGNOSIS — J302 Other seasonal allergic rhinitis: Secondary | ICD-10-CM | POA: Diagnosis not present

## 2019-04-21 DIAGNOSIS — E0500 Thyrotoxicosis with diffuse goiter without thyrotoxic crisis or storm: Secondary | ICD-10-CM

## 2019-04-21 MED ORDER — FLUTICASONE PROPIONATE 50 MCG/ACT NA SUSP: 2.0000 | Freq: Every day | NASAL | 3 refills | Status: AC

## 2019-04-21 MED ORDER — MONTELUKAST SODIUM 10 MG PO TABS: 10.0000 mg | ORAL_TABLET | Freq: Every day | ORAL | 3 refills | Status: DC

## 2019-04-21 MED ORDER — SUMATRIPTAN SUCCINATE 50 MG PO TABS: 100.0000 mg | ORAL_TABLET | Freq: Once | ORAL | 3 refills | Status: DC

## 2019-04-21 NOTE — Progress Notes (Signed)
Virtual Visit via Telephone Note  I connected with [name], on 04/21/2019 at 9:09 AM by telephone due to the COVID-19 pandemic and verified that I am speaking with the correct person using two identifiers.   Consent: I discussed the limitations, risks, security and privacy concerns of performing an evaluation and management service by telephone and the availability of in person appointments. I also discussed with the patient that there may be a patient responsible charge related to this service. The patient expressed understanding and agreed to proceed.   Location of Patient: Home  Location of Provider: Clinic   Persons participating in Telemedicine visit: Judah Roselind Messier [place]-CMA Dr. Alvis Lemmings     History of Present Illness: 39 year old female with a history of Migraines, Graves disease, Seasonal allergies seen for a follow up visit.  Her migraines occur 2x/week and are controlled on Imitrex; she does not need prophylactic medications. Migraines occur with an aura and are trigerred by her allergies. She has photophobia and phonophbia but no nausea, vomiting. She states she needs to make an appointment with her Endocrinologist as she has been out of her Methimazole for her Graves disease. Last seen by [facility] in 06/2018. Notes reviewed which indicate plan to titrate her off Methimazole with time. She has no additional concerns today.  Past Medical History:  Diagnosis Date  . Allergy    No Known Allergies  Current Outpatient Medications on File Prior to Visit  Medication Sig Dispense Refill  . cetirizine (ZYRTEC) 10 MG tablet Take 1 tablet (10 mg total) by mouth daily. 30 tablet 11  . fluticasone (FLONASE) 50 MCG/ACT nasal spray Place 2 sprays into both nostrils daily. 16 g 6  . SUMAtriptan (IMITREX) 50 MG tablet Take 2 tablets (100 mg total) by mouth every 2 (two) hours as needed for migraine. May repeat in 2 hours if headache  persists or recurs. 20 tablet 1  . methimazole (TAPAZOLE) 5 MG tablet Take 0.5 tablets by mouth daily.    . montelukast (SINGULAIR) 10 MG tablet Take 1 tablet (10 mg total) by mouth at bedtime. (Patient not taking: Reported on 04/21/2019) 30 tablet 3  . naproxen (NAPROSYN) 500 MG tablet Take 1 tablet (500 mg total) by mouth 2 (two) times daily with a meal. (Patient not taking: Reported on 12/15/2018) 30 tablet 0   No current facility-administered medications on file prior to visit.    Observations/Objective: Alert, awake, oriented x3 Not in acute distress  Lab Results  Component Value Date   TSH <0.006 (L) 04/14/2018     Assessment and Plan: 1. Seasonal allergies Controlled - fluticasone (FLONASE) 50 MCG/ACT nasal spray; Place 2 sprays into both nostrils daily.  Dispense: 16 g; Refill: 3 - montelukast (SINGULAIR) 10 MG tablet; Take 1 tablet (10 mg total) by mouth at bedtime.  Dispense: 30 tablet; Refill: 3  2. Migraine with aura and without status migrainosus, not intractable Stable No indication for prophylactic medications at this time - SUMAtriptan (IMITREX) 50 MG tablet; Take 2 tablets (100 mg total) by mouth once for 1 dose. May repeat in 2 hours if headache persists or recurs.  Dispense: 30 tablet; Refill: 3  3. Graves disease Has been off Methimazole Followed [facility]ology [facility] Advised to make an appointment   Follow Up Instructions: Return in about 3 months (around 07/20/2019).    I discussed the assessment and treatment plan with the patient. The patient was provided an opportunity to ask  questions and all were answered. The patient agreed with the plan and demonstrated an understanding of the instructions.   The patient was advised to call back or seek an in-person evaluation if the symptoms worsen or if the condition fails to improve as anticipated.     I provided 16 minutes total of non-face-to-face time during this encounter including median  intraservice time, reviewing previous notes, investigations, ordering medications, medical decision making, coordinating care and patient verbalized understanding at the end of the visit.     Charlott Rakes, MD, FAAFP. [facility] [place], [place]   04/21/2019, 9:09 AM

## 2019-04-21 NOTE — Progress Notes (Signed)
Patient verified DOB Patient has not eaten or taken medication today. Patient denies pain at this time. Patient request refill on nasal spray and imitrex.

## 2019-06-10 ENCOUNTER — Other Ambulatory Visit: Payer: Self-pay | Admitting: Family Medicine

## 2019-06-10 DIAGNOSIS — G43109 Migraine with aura, not intractable, without status migrainosus: Secondary | ICD-10-CM

## 2019-06-12 NOTE — Telephone Encounter (Signed)
Please review and refill if appropriate under new PCP.

## 2019-06-13 MED ORDER — CETIRIZINE HCL 10 MG PO TABS: 10.0000 mg | ORAL_TABLET | Freq: Every day | ORAL | 11 refills | Status: AC

## 2019-06-13 MED ORDER — SUMATRIPTAN SUCCINATE 50 MG PO TABS: 100.0000 mg | ORAL_TABLET | Freq: Once | ORAL | 2 refills | Status: DC | PRN

## 2019-06-16 ENCOUNTER — Other Ambulatory Visit: Payer: Self-pay | Admitting: Family Medicine

## 2019-06-16 NOTE — Telephone Encounter (Signed)
Patient at [facility]- sent for review of request

## 2019-07-22 ENCOUNTER — Encounter (HOSPITAL_COMMUNITY): Payer: Self-pay | Admitting: Emergency Medicine

## 2019-07-22 ENCOUNTER — Emergency Department (HOSPITAL_COMMUNITY)
Admission: EM | Admit: 2019-07-22 | Discharge: 2019-07-22 | Disposition: A | Discharge status: Home / Self Care | Payer: Medicaid Other | Attending: Emergency Medicine | Admitting: Emergency Medicine

## 2019-07-22 DIAGNOSIS — Z87891 Personal history of nicotine dependence: Secondary | ICD-10-CM | POA: Diagnosis not present

## 2019-07-22 DIAGNOSIS — Z20822 Contact with and (suspected) exposure to covid-19: Secondary | ICD-10-CM | POA: Diagnosis not present

## 2019-07-22 DIAGNOSIS — Z79899 Other long term (current) drug therapy: Secondary | ICD-10-CM | POA: Diagnosis not present

## 2019-07-22 DIAGNOSIS — Z03818 Encounter for observation for suspected exposure to other biological agents ruled out: Secondary | ICD-10-CM | POA: Diagnosis not present

## 2019-07-22 HISTORY — DX: Thyrotoxicosis, unspecified without thyrotoxic crisis or storm: E05.90

## 2019-07-22 LAB — SARS CORONAVIRUS 2 (TAT 6-24 HRS): SARS Coronavirus 2: NEGATIVE

## 2019-07-22 NOTE — ED Notes (Signed)
Discharge given from doorway to minimize contact and conserve PPE. Mom expressed understanding of discharge and denies any further questions or needs at this time.  

## 2019-07-22 NOTE — ED Triage Notes (Signed)
Pt exposed to COVID. No reported symptoms.  

## 2019-07-22 NOTE — ED Provider Notes (Signed)
[facility] EMERGENCY DEPARTMENT Provider Note   CSN: [id number] Arrival date & time: 07/22/19  1642     History Chief Complaint  Patient presents with  . Covid Exposure    Sandy Berger is a 39 y.o. female.  39 year old who presents for Covid testing.  Patient reports she had a recent relative with Covid test positive yesterday.  Patient denies any symptoms.  No cough, no fever, no vomiting, no abdominal pain.  The history is provided by the patient. No language interpreter was used.       Past Medical History:  Diagnosis Date  . Allergy   . Hyperthyroidism     Patient Active Problem List   Diagnosis Date Noted  . Allergy   . Urinary incontinence in female 12/15/2018  . Vaginal discharge 09/30/2018  . Pelvic pain 09/30/2018    Past Surgical History:  Procedure Laterality Date  . CESAREAN SECTION       OB History   No obstetric history on file.     Family History  Problem Relation Age of Onset  . Hyperlipidemia Mother   . Hypertension Paternal Uncle   . Breast cancer Sister 63  . Diabetes Neg Hx   . Stroke Neg Hx     Social History   Tobacco Use  . Smoking status: Former Research scientist (life sciences)  . Smokeless tobacco: Never Used  Substance Use Topics  . Alcohol use: Yes  . Drug use: Never    Home Medications Prior to Admission medications   Medication Sig Start Date End Date Taking? Authorizing Provider  cetirizine (ZYRTEC) 10 MG tablet Take 1 tablet (10 mg total) by mouth daily. 06/13/19   Nicolette Bang, DO  fluticasone (FLONASE) 50 MCG/ACT nasal spray Place 2 sprays into both nostrils daily. 04/21/19   Charlott Rakes, MD  methimazole (TAPAZOLE) 5 MG tablet Take 0.5 tablets by mouth daily. 07/19/18 07/19/19  [provider]  montelukast (SINGULAIR) 10 MG tablet Take 1 tablet (10 mg total) by mouth at bedtime. 04/21/19   Charlott Rakes, MD  naproxen (NAPROSYN) 500 MG tablet Take 1 tablet (500 mg total) by mouth 2 (two)  times daily with a meal. Patient not taking: Reported on 12/15/2018 07/26/18   Scot Jun, FNP  SUMAtriptan (IMITREX) 50 MG tablet Take 2 tablets (100 mg total) by mouth once as needed for up to 1 dose for migraine. May repeat in 2 hours if headache persists or recurs. 06/13/19   Nicolette Bang, DO    Allergies    Patient has no known allergies.  Review of Systems   Review of Systems  All other systems reviewed and are negative.   Physical Exam Updated Vital Signs BP 126/83 (BP Location: Left Arm)   Pulse 86   Temp 98.7 F (37.1 C) (Oral)   Resp 17   SpO2 99%   Physical Exam Vitals and nursing note reviewed.  Constitutional:      Appearance: She is well-developed.  HENT:     Head: Normocephalic and atraumatic.     Right Ear: External ear normal.     Left Ear: External ear normal.  Eyes:     Conjunctiva/sclera: Conjunctivae normal.  Cardiovascular:     Rate and Rhythm: Normal rate.     Heart sounds: Normal heart sounds.  Pulmonary:     Effort: Pulmonary effort is normal.     Breath sounds: Normal breath sounds.  Abdominal:     General: Bowel sounds are normal.  Palpations: Abdomen is soft.     Tenderness: There is no abdominal tenderness. There is no rebound.  Musculoskeletal:        General: Normal range of motion.     Cervical back: Normal range of motion and neck supple.  Skin:    General: Skin is warm.  Neurological:     Mental Status: She is alert and oriented to person, place, and time.     ED Results / Procedures / Treatments   Labs (all labs ordered are listed, but only abnormal results are displayed) Labs Reviewed  SARS CORONAVIRUS 2 (TAT 6-24 HRS)    EKG None  Radiology No results found.  Procedures Procedures (including critical care time)  Medications Ordered in ED Medications - No data to display  ED Course  I have reviewed the triage vital signs and the nursing notes.  Pertinent labs & imaging results that were  available during my care of the patient were reviewed by me and considered in my medical decision making (see chart for details).    MDM Rules/Calculators/A&P                      39 year old who presents for Covid testing.  Will obtain Covid test.  Will discharge home.  Discussed need to quarantine.  Discussed signs and warrant reevaluation.  Patient's vitals remain with normal O2 sat, normal pulse, normal respiratory rate, and without a fever.  Sandy Berger was evaluated in Emergency Department on 07/22/2019 for the symptoms described in the history of present illness. She was evaluated in the context of the global COVID-19 pandemic, which necessitated consideration that the patient might be at risk for infection with the SARS-CoV-2 virus that causes COVID-19. Institutional protocols and algorithms that pertain to the evaluation of patients at risk for COVID-19 are in a state of rapid change based on information released by regulatory bodies including the CDC and federal and state organizations. These policies and algorithms were followed during the patient's care in the ED.    Final Clinical Impression(s) / ED Diagnoses Final diagnoses:  Close exposure to COVID-19 virus    Rx / DC Orders ED Discharge Orders    None       Niel Hummer, MD 07/22/19 1750

## 2019-08-25 ENCOUNTER — Telehealth: Payer: Self-pay | Admitting: Family Medicine

## 2019-08-25 NOTE — Telephone Encounter (Signed)
Copied from her CPE note from 03/2018: She is currently taking Fioricet but having to dose it daily and feels she needs something stronger to manage her headaches.  She was switched from the Butalbital to the Imitrex at that appointment based on her discussion with Selena Batten.  She can discuss restarting medication at her visit with Earlene Plater later this month.

## 2019-08-25 NOTE — Telephone Encounter (Signed)
1) Medication(s) Requested (by name):butalbital-acetaminophen-caffeine (FIORICET, ESGIC) 50-325-40 MG tablet [[id number]] ENDED   2) Pharmacy of Choice: [facility]  3) Special Requests:   Approved medications will be sent to the pharmacy, we will reach out if there is an issue.  Requests made after 3pm may not be addressed until the following business day!  If a patient is unsure of the name of the medication(s) please note and ask patient to call back when they are able to provide all info, do not send to responsible party until all information is available!

## 2019-08-31 ENCOUNTER — Ambulatory Visit
Admission: EM | Admit: 2019-08-31 | Discharge: 2019-08-31 | Disposition: A | Discharge status: Home / Self Care | Payer: Medicaid Other | Attending: Emergency Medicine | Admitting: Emergency Medicine

## 2019-08-31 ENCOUNTER — Other Ambulatory Visit: Payer: Self-pay

## 2019-08-31 DIAGNOSIS — S8012XA Contusion of left lower leg, initial encounter: Secondary | ICD-10-CM | POA: Diagnosis not present

## 2019-08-31 DIAGNOSIS — H60312 Diffuse otitis externa, left ear: Secondary | ICD-10-CM | POA: Diagnosis not present

## 2019-08-31 MED ORDER — NAPROXEN 500 MG PO TABS: 500.0000 mg | ORAL_TABLET | Freq: Two times a day (BID) | ORAL | 0 refills | Status: DC

## 2019-08-31 MED ORDER — NEOMYCIN-POLYMYXIN-HC 3.5-10000-1 OT SOLN: 3.0000 [drp] | Freq: Four times a day (QID) | OTIC | 0 refills | Status: DC

## 2019-08-31 NOTE — ED Triage Notes (Signed)
Pt c/o lt ear pain x3 days. Pt c/o unknown bruise to the back of lt calf.

## 2019-08-31 NOTE — Discharge Instructions (Signed)
Use eardrops as prescribed for the next week. Return for worsening ear pain, swelling, discharge, bleeding, decreased hearing, development of jaw pain/swelling, fever.  Do NOT use Q-tips as these can cause your ear wax to get stuck, the tips may break off and become a foreign body requiring additional medical care, or puncture your eardrum.  Helpful prevention tip: Use a solution of equal parts isopropyl (rubbing) alcohol and white vinegar (acetic acid) in both ears after swimming. 

## 2019-08-31 NOTE — ED Provider Notes (Signed)
[name]    CSN: [id number] Arrival date & time: 08/31/19  1718      History   Chief Complaint Chief Complaint  Patient presents with  . Otalgia    HPI Sandy Berger is a 39 y.o. female with history of allergies and hypothyroidism presenting for left ear pain x3 days.  Denies recent travel, trauma, water exposure.  No change in hearing, tinnitus, dizziness.  Has not tried thing for relief. Patient also notes left calf bruising with unknown cause.  Denies history of easy bruising, bleeding.  No change in medications.  Denies history of blood clots or trauma.  Denies pain, claudication.  Has not tried nothing for this.  Past Medical History:  Diagnosis Date  . Allergy   . Hyperthyroidism     Patient Active Problem List   Diagnosis Date Noted  . Allergy   . Urinary incontinence in female 12/15/2018  . Vaginal discharge 09/30/2018  . Pelvic pain 09/30/2018    Past Surgical History:  Procedure Laterality Date  . CESAREAN SECTION      OB History   No obstetric history on file.      Home Medications    Prior to Admission medications   Medication Sig Start Date End Date Taking? Authorizing Provider  cetirizine (ZYRTEC) 10 MG tablet Take 1 tablet (10 mg total) by mouth daily. 06/13/19   Arvilla Market, DO  fluticasone (FLONASE) 50 MCG/ACT nasal spray Place 2 sprays into both nostrils daily. 04/21/19   Hoy Register, MD  naproxen (NAPROSYN) 500 MG tablet Take 1 tablet (500 mg total) by mouth 2 (two) times daily. 08/31/19   Hall-Potvin, [place], PA-C  neomycin-polymyxin-hydrocortisone (CORTISPORIN) OTIC solution Place 3 drops into the left ear 4 (four) times daily. 08/31/19   Hall-Potvin, [place], PA-C    Family History Family History  Problem Relation Age of Onset  . Hyperlipidemia Mother   . Hypertension Paternal Uncle   . Breast cancer Sister 97  . Diabetes Neg Hx   . Stroke Neg Hx     Social History Social History   Tobacco  Use  . Smoking status: Former Games developer  . Smokeless tobacco: Never Used  Substance Use Topics  . Alcohol use: Yes  . Drug use: Never     Allergies   Patient has no known allergies.   Review of Systems As per HPI   Physical Exam Triage Vital Signs ED Triage Vitals [08/31/19 1802]  Enc Vitals Group     BP 133/72     Pulse Rate 76     Resp 18     Temp 98.4 F (36.9 C)     Temp Source Oral     SpO2 97 %     Weight      Height      Head Circumference      Peak Flow      Pain Score 3     Pain Loc      Pain Edu?      Excl. in GC?    No data found.  Updated Vital Signs BP 133/72 (BP Location: Left Arm)   Pulse 76   Temp 98.4 F (36.9 C) (Oral)   Resp 18   LMP 08/05/2019   SpO2 97%   Visual Acuity Right Eye Distance:   Left Eye Distance:   Bilateral Distance:    Right Eye Near:   Left Eye Near:    Bilateral Near:     Physical Exam Constitutional:  General: She is not in acute distress. HENT:     Head: Normocephalic and atraumatic.     Jaw: There is normal jaw occlusion. No tenderness or pain on movement.     Right Ear: Hearing, tympanic membrane, ear canal and external ear normal. No tenderness. No mastoid tenderness.     Left Ear: Hearing, tympanic membrane and external ear normal. No tenderness. No mastoid tenderness.     Ears:     Comments: Mild positive tragal tenderness of left ear.  Left EAC with swelling, discharge    Nose: No nasal deformity, septal deviation or nasal tenderness.     Right Turbinates: Not swollen or pale.     Left Turbinates: Not swollen or pale.     Right Sinus: No maxillary sinus tenderness or frontal sinus tenderness.     Left Sinus: No maxillary sinus tenderness or frontal sinus tenderness.     Mouth/Throat:     Lips: Pink. No lesions.     Mouth: Mucous membranes are moist. No injury.     Pharynx: Oropharynx is clear. Uvula midline. No posterior oropharyngeal erythema or uvula swelling.     Comments: no tonsillar  exudate or hypertrophy Eyes:     General: No scleral icterus.    Conjunctiva/sclera: Conjunctivae normal.     Pupils: Pupils are equal, round, and reactive to light.  Cardiovascular:     Rate and Rhythm: Normal rate and regular rhythm.  Pulmonary:     Effort: Pulmonary effort is normal. No respiratory distress.     Breath sounds: No wheezing.  Musculoskeletal:     Cervical back: Normal range of motion and neck supple. No muscular tenderness.     Right lower leg: No edema.     Left lower leg: No edema.     Comments: No calf tenderness, cords appreciated.  Negative Homans' sign.  Calves symmetric in size bilaterally  Lymphadenopathy:     Cervical: No cervical adenopathy.  Skin:    Capillary Refill: Capillary refill takes less than 2 seconds.     Findings: Bruising present.     Comments: Circumferential bruise (~7 cm) without edema, crepitus, tenderness  Neurological:     Mental Status: She is alert and oriented to person, place, and time.      UC Treatments / Results  Labs (all labs ordered are listed, but only abnormal results are displayed) Labs Reviewed - No data to display  EKG   Radiology No results found.  Procedures Procedures (including critical care time)  Medications Ordered in UC Medications - No data to display  Initial Impression / Assessment and Plan / UC Course  I have reviewed the triage vital signs and the nursing notes.  Pertinent labs & imaging results that were available during my care of the patient were reviewed by me and considered in my medical decision making (see chart for details).     Patient febrile, nontoxic and is hemodynamically stable in office.  Patient does have left calf contusion without known cause.  Denies history of blood clot.  No anticoagulant use or mechanism of injury.  No other areas of bruising.  Lesion is nontender and she is without signs or symptoms concerning for acute blood clot.  Discussed repair and precautions  thereof.  Will treat supportively with warm compresses, NSAID.  Patient does have AOE of left ear: Cortisporin sent. Final Clinical Impressions(s) / UC Diagnoses   Final diagnoses:  Acute diffuse otitis externa of left ear  Contusion of left  calf, initial encounter     Discharge Instructions     Use eardrops as prescribed for the next week. Return for worsening ear pain, swelling, discharge, bleeding, decreased hearing, development of jaw pain/swelling, fever.  Do NOT use Q-tips as these can cause your ear wax to get stuck, the tips may break off and become a foreign body requiring additional medical care, or puncture your eardrum.  Helpful prevention tip: Use a solution of equal parts isopropyl (rubbing) alcohol and white vinegar (acetic acid) in both ears after swimming.    ED Prescriptions    Medication Sig Dispense Auth. Provider   naproxen (NAPROSYN) 500 MG tablet Take 1 tablet (500 mg total) by mouth 2 (two) times daily. 30 tablet Hall-Potvin, [place], PA-C   neomycin-polymyxin-hydrocortisone (CORTISPORIN) OTIC solution Place 3 drops into the left ear 4 (four) times daily. 10 mL Hall-Potvin, [place], PA-C     PDMP not reviewed this encounter.   Neldon Mc [place], [place] 08/31/19 1835

## 2019-09-16 ENCOUNTER — Telehealth (INDEPENDENT_AMBULATORY_CARE_PROVIDER_SITE_OTHER): Payer: Medicaid Other | Admitting: Internal Medicine

## 2019-09-16 ENCOUNTER — Other Ambulatory Visit: Payer: Self-pay

## 2019-09-16 ENCOUNTER — Encounter: Payer: Self-pay | Admitting: Internal Medicine

## 2019-09-16 DIAGNOSIS — G43109 Migraine with aura, not intractable, without status migrainosus: Secondary | ICD-10-CM

## 2019-09-16 DIAGNOSIS — H9211 Otorrhea, right ear: Secondary | ICD-10-CM

## 2019-09-16 DIAGNOSIS — E0590 Thyrotoxicosis, unspecified without thyrotoxic crisis or storm: Secondary | ICD-10-CM | POA: Insufficient documentation

## 2019-09-16 MED ORDER — BUTALBITAL-APAP-CAFFEINE 50-325-40 MG PO TABS: 1.0000 | ORAL_TABLET | Freq: Four times a day (QID) | ORAL | 1 refills | Status: AC | PRN

## 2019-09-16 NOTE — Progress Notes (Signed)
Virtual Visit via Telephone Note  I connected with Sandy Berger, on 09/16/2019 at 10:33 AM by telephone due to the COVID-19 pandemic and verified that I am speaking with the correct person using two identifiers.   Consent: I discussed the limitations, risks, security and privacy concerns of performing an evaluation and management service by telephone and the availability of in person appointments. I also discussed with the patient that there may be a patient responsible charge related to this service. The patient expressed understanding and agreed to proceed.   Location of Patient: Home   Location of Provider: Clinic    Persons participating in Telemedicine visit: [facility] Dr. Juleen China      History of Present Illness: Patient has a visit to follow up on migraine headaches. Currently she is taking Motrin for her headaches. Says that Sumatriptan that was previously prescribed was the wrong medication. She is requesting to be restarted on Fioricet. Says that she is currently getting a migraine headache every single day. However, says when she takes Fioricet and it works as abortive medication and also seems to lessen the number of headaches she has per week. Has never been on prophylaxis medication.    She also requests referral to ENT for right ear problems. States she has asked for referral in the past and never heard any follow up. Says she has had chronic ear problems and I can read about them in her chart.   Has a history of hyperthyroidism. Was previously followed by endocrinology but no showed a visit and never followed back up. Asks for another referral. She was taking Methimazole 5 mg and then this was cut in half to 2.5 mg. Has not been taking any medication for >1 year.    Past Medical History:  Diagnosis Date  . Allergy   . Hyperthyroidism    No Known Allergies  Current Outpatient Medications on File Prior to Visit  Medication Sig  Dispense Refill  . cetirizine (ZYRTEC) 10 MG tablet Take 1 tablet (10 mg total) by mouth daily. 30 tablet 11  . fluticasone (FLONASE) 50 MCG/ACT nasal spray Place 2 sprays into both nostrils daily. 16 g 3  . naproxen (NAPROSYN) 500 MG tablet Take 1 tablet (500 mg total) by mouth 2 (two) times daily. 30 tablet 0  . neomycin-polymyxin-hydrocortisone (CORTISPORIN) OTIC solution Place 3 drops into the left ear 4 (four) times daily. 10 mL 0   No current facility-administered medications on file prior to visit.    Observations/Objective: NAD. Speaking clearly.  Work of breathing normal.  Alert and oriented. Mood appropriate.   Assessment and Plan: 1. Migraine with aura and without status migrainosus, not intractable Will restart on Fioricet. Discussed this medication should not be used daily. If she is still having daily headaches would consider starting prophylaxis with something such as Topamax.  - butalbital-acetaminophen-caffeine (FIORICET) 50-325-40 MG tablet; Take 1 tablet by mouth every 6 (six) hours as needed for headache or migraine.  Dispense: 30 tablet; Refill: 1  2. Drainage from right ear Per chart review appears patient has also had history of external ear surgery, hearing loss of right ear, and chronic allergies.  - Ambulatory referral to ENT  3. Hyperthyroidism - Ambulatory referral to Endocrinology   Follow Up Instructions: Annual exam    I discussed the assessment and treatment plan with the patient. The patient was provided an opportunity to ask questions and all were answered. The patient agreed with the plan and demonstrated  an understanding of the instructions.   The patient was advised to call back or seek an in-person evaluation if the symptoms worsen or if the condition fails to improve as anticipated.     I provided 26 minutes total of non-face-to-face time during this encounter including median intraservice time, reviewing previous notes, investigations,  ordering medications, medical decision making, coordinating care and patient verbalized understanding at the end of the visit.    Marcy Siren, D.O. Primary Care at [facility]  09/16/2019, 10:33 AM

## 2019-11-18 ENCOUNTER — Ambulatory Visit: Payer: Medicaid Other | Admitting: Internal Medicine

## 2019-12-02 ENCOUNTER — Other Ambulatory Visit: Payer: Self-pay

## 2019-12-02 ENCOUNTER — Ambulatory Visit
Admission: EM | Admit: 2019-12-02 | Discharge: 2019-12-02 | Disposition: A | Discharge status: Home / Self Care | Payer: Medicaid Other | Attending: Emergency Medicine | Admitting: Emergency Medicine

## 2019-12-02 DIAGNOSIS — Z8639 Personal history of other endocrine, nutritional and metabolic disease: Secondary | ICD-10-CM | POA: Diagnosis not present

## 2019-12-02 DIAGNOSIS — R5383 Other fatigue: Secondary | ICD-10-CM | POA: Diagnosis not present

## 2019-12-02 NOTE — ED Provider Notes (Signed)
[name]    CSN: [id number] Arrival date & time: 12/02/19  0903      History   Chief Complaint Chief Complaint  Patient presents with  . Fatigue    HPI Sandy Berger is a 39 y.o. female  Presenting for chronic fatigue.  States this has been ongoing for over a month.  States she received a text today that she has an appoint with her PCP on 9/14: Intends to keep this.  Denies difficulty sleeping, increased stress level, SI/HI.  No recent change in diet, lifestyle, medications.  No easy bruising, bleeding, change in hair, skin, nails, bowel habits.  No fever, arthralgias, myalgias, known bug bites.  Has not taken thing for this.  Denies history of iron deficiency anemia.  Does have history of Graves' disease: Last seen by her endocrinologist in April of this year.  Has not been on methimazole "for a while now ".  No chest pain, palpitations, change in breathing.  Past Medical History:  Diagnosis Date  . Allergy   . Hyperthyroidism     Patient Active Problem List   Diagnosis Date Noted  . Hyperthyroidism 09/16/2019  . Allergy   . Urinary incontinence in female 12/15/2018  . Vaginal discharge 09/30/2018  . Pelvic pain 09/30/2018    Past Surgical History:  Procedure Laterality Date  . CESAREAN SECTION      OB History   No obstetric history on file.      Home Medications    Prior to Admission medications   Medication Sig Start Date End Date Taking? Authorizing Provider  butalbital-acetaminophen-caffeine (FIORICET) 50-325-40 MG tablet Take 1 tablet by mouth every 6 (six) hours as needed for headache or migraine. 09/16/19 09/15/20  Arvilla Market, DO  cetirizine (ZYRTEC) 10 MG tablet Take 1 tablet (10 mg total) by mouth daily. 06/13/19   Arvilla Market, DO  fluticasone (FLONASE) 50 MCG/ACT nasal spray Place 2 sprays into both nostrils daily. 04/21/19   Hoy Register, MD  Multiple Vitamin (MULTIVITAMIN) tablet Take 1 tablet by  mouth daily.    [provider]    Family History Family History  Problem Relation Age of Onset  . Hyperlipidemia Mother   . Hypertension Paternal Uncle   . Breast cancer Sister 3  . Diabetes Neg Hx   . Stroke Neg Hx     Social History Social History   Tobacco Use  . Smoking status: Former Games developer  . Smokeless tobacco: Never Used  Substance Use Topics  . Alcohol use: Yes  . Drug use: Never     Allergies   Patient has no known allergies.   Review of Systems As per HPI   Physical Exam Triage Vital Signs ED Triage Vitals  Enc Vitals Group     BP 12/02/19 0924 (!) 144/94     Pulse Rate 12/02/19 0924 74     Resp 12/02/19 0924 18     Temp 12/02/19 0924 98.2 F (36.8 C)     Temp Source 12/02/19 0924 Oral     SpO2 12/02/19 0924 98 %     Weight --      Height --      Head Circumference --      Peak Flow --      Pain Score 12/02/19 0925 0     Pain Loc --      Pain Edu? --      Excl. in GC? --    No data found.  Updated  Vital Signs BP (!) 144/94 (BP Location: Left Arm)   Pulse 74   Temp 98.2 F (36.8 C) (Oral)   Resp 18   LMP 12/01/2019   SpO2 98%   Visual Acuity Right Eye Distance:   Left Eye Distance:   Bilateral Distance:    Right Eye Near:   Left Eye Near:    Bilateral Near:     Physical Exam Vitals reviewed.  Constitutional:      General: She is not in acute distress.    Appearance: She is normal weight. She is not ill-appearing.  HENT:     Head: Normocephalic and atraumatic.     Mouth/Throat:     Mouth: Mucous membranes are moist.     Pharynx: Oropharynx is clear.  Eyes:     General: No scleral icterus.       Right eye: No discharge.        Left eye: No discharge.     Extraocular Movements: Extraocular movements intact.     Pupils: Pupils are equal, round, and reactive to light.  Neck:     Comments: No thyromegaly or nodules noted Cardiovascular:     Rate and Rhythm: Normal rate and regular rhythm.     Pulses: Normal  pulses.     Heart sounds: No murmur heard.   Pulmonary:     Effort: Pulmonary effort is normal. No respiratory distress.     Breath sounds: No wheezing.  Chest:     Chest wall: No tenderness.  Abdominal:     General: Abdomen is flat.     Palpations: Abdomen is soft.     Tenderness: There is no abdominal tenderness. There is no guarding.  Musculoskeletal:     Cervical back: Normal range of motion and neck supple. No tenderness. No muscular tenderness.  Lymphadenopathy:     Cervical: No cervical adenopathy.  Skin:    General: Skin is warm.     Capillary Refill: Capillary refill takes less than 2 seconds.     Coloration: Skin is not jaundiced or pale.     Findings: No rash.  Neurological:     General: No focal deficit present.     Mental Status: She is alert and oriented to person, place, and time.  Psychiatric:        Mood and Affect: Mood normal.        Thought Content: Thought content normal.      UC Treatments / Results  Labs (all labs ordered are listed, but only abnormal results are displayed) Labs Reviewed  TSH  T4, FREE  T3 UPTAKE    EKG   Radiology No results found.  Procedures Procedures (including critical care time)  Medications Ordered in UC Medications - No data to display  Initial Impression / Assessment and Plan / UC Course  I have reviewed the triage vital signs and the nursing notes.  Pertinent labs & imaging results that were available during my care of the patient were reviewed by me and considered in my medical decision making (see chart for details).     Patient febrile, nontoxic, hemodynamically stable in office.  Low concern for acute pathology at this time given chronicity of fatigue and reassuring exam.  Per chart review, patient has appointment with PCP on 9/14.  Will obtain basic thyroid labs and defer to PCP for further evaluation and management of fatigue.  Return precautions discussed, pt verbalized understanding and is agreeable  to plan. Final Clinical Impressions(s) / UC Diagnoses  Final diagnoses:  Fatigue, unspecified type  History of Graves' disease  History of hyperthyroidism     Discharge Instructions     Keep appointment on 9/14 for further evaluation. Will call you on cell with results of blood work today.    ED Prescriptions    None     PDMP not reviewed this encounter.   Hall[place]vin, [place], [place] 12/02/19 1058

## 2019-12-02 NOTE — Patient Instructions (Signed)

## 2019-12-02 NOTE — Discharge Instructions (Addendum)
Keep appointment on 9/14 for further evaluation. Will call you on cell with results of blood work today.

## 2019-12-02 NOTE — ED Triage Notes (Signed)
Pt c/o feeling tired for over a month. States unable to get in with her PCP. Pt denies any URI or covid sx's.

## 2019-12-03 LAB — T4, FREE: Free T4: 1.05 ng/dL (ref 0.82–1.77)

## 2019-12-03 LAB — T3 UPTAKE: T3 Uptake Ratio: 20 % — ABNORMAL LOW (ref 24–39)

## 2019-12-03 LAB — TSH: TSH: 2.2 u[IU]/mL (ref 0.450–4.500)

## 2019-12-06 ENCOUNTER — Other Ambulatory Visit: Payer: Self-pay

## 2019-12-06 ENCOUNTER — Ambulatory Visit (INDEPENDENT_AMBULATORY_CARE_PROVIDER_SITE_OTHER): Payer: Medicaid Other | Admitting: Family Medicine

## 2019-12-06 ENCOUNTER — Other Ambulatory Visit (HOSPITAL_COMMUNITY)
Admission: RE | Admit: 2019-12-06 | Discharge: 2019-12-06 | Disposition: A | Discharge status: Home / Self Care | Payer: Medicaid Other | Source: Ambulatory Visit | Attending: Internal Medicine | Admitting: Internal Medicine

## 2019-12-06 VITALS — BP 113/75 | HR 66 | Temp 97.3°F | Resp 17 | Ht 64.0 in | Wt 201.0 lb

## 2019-12-06 DIAGNOSIS — Z13220 Encounter for screening for lipoid disorders: Secondary | ICD-10-CM

## 2019-12-06 DIAGNOSIS — Z113 Encounter for screening for infections with a predominantly sexual mode of transmission: Secondary | ICD-10-CM

## 2019-12-06 DIAGNOSIS — Z131 Encounter for screening for diabetes mellitus: Secondary | ICD-10-CM

## 2019-12-06 DIAGNOSIS — K625 Hemorrhage of anus and rectum: Secondary | ICD-10-CM | POA: Diagnosis not present

## 2019-12-06 DIAGNOSIS — Z13228 Encounter for screening for other metabolic disorders: Secondary | ICD-10-CM | POA: Diagnosis not present

## 2019-12-06 DIAGNOSIS — R5383 Other fatigue: Secondary | ICD-10-CM

## 2019-12-06 DIAGNOSIS — Z1159 Encounter for screening for other viral diseases: Secondary | ICD-10-CM | POA: Diagnosis not present

## 2019-12-06 DIAGNOSIS — Z0000 Encounter for general adult medical examination without abnormal findings: Secondary | ICD-10-CM | POA: Diagnosis not present

## 2019-12-06 DIAGNOSIS — E559 Vitamin D deficiency, unspecified: Secondary | ICD-10-CM | POA: Diagnosis not present

## 2019-12-06 DIAGNOSIS — Z130 Encounter for screening for diseases of the blood and blood-forming organs and certain disorders involving the immune mechanism: Secondary | ICD-10-CM

## 2019-12-06 LAB — POCT URINALYSIS DIP (CLINITEK)
Bilirubin, UA: NEGATIVE
Glucose, UA: NEGATIVE mg/dL
Ketones, POC UA: NEGATIVE mg/dL
Nitrite, UA: NEGATIVE
POC PROTEIN,UA: NEGATIVE
Spec Grav, UA: 1.025 (ref 1.010–1.025)
Urobilinogen, UA: 0.2 E.U./dL
pH, UA: 6.5 (ref 5.0–8.0)

## 2019-12-06 NOTE — Progress Notes (Signed)
Patient ID: Sandy Berger, female    DOB: Jul 30, 1980, 39 y.o.   MRN: [id number]  PCP: Arvilla Market, DO  Chief Complaint  Patient presents with  . Annual Exam    Subjective:  HPI Sandy Berger is a 39 y.o. female,non smoker, presents for complete physical exam. She is concern with symptoms of fatigue > 1 month and 3 years of occasional rectal bleeding.   Fatigue- history hyperthyroidism,without treatment. Recent thyroid studies all within normal range.  She has been visiting family in [place] and [place] over the last 2 months and wonders if the disruption in her normal routine has caused fatigue.  Rectal bleeding -problem occurring intermittently since the birth of her last son 3 years ago. Rectal bleeding occurs occasionally with normal non-hardened stool. Blood is bright. Unaware of any family history of GI cancers. No prior colonoscopies or EGD     Chronic conditions include: Patient Active Problem List   Diagnosis Date Noted  . Hyperthyroidism 09/16/2019  . Allergy   . Urinary incontinence in female 12/15/2018  . Vaginal discharge 09/30/2018  . Pelvic pain 09/30/2018     Health Promotion:  Routine physical activity:  Current BMI: Body mass index is 34.5 kg/m.  Dietary habits:  Health Screening Current/Overdue:   Immunizations-COVID-19 received 2/2 -last 4 days. PAP-current Mammogram, began at age 55 Last Dental Exam: Dental  Last Eye Exam: Any visual acuity changes: NO.  Current home medications include: Prior to Admission medications   Medication Sig Start Date End Date Taking? Authorizing Provider  butalbital-acetaminophen-caffeine (FIORICET) 50-325-40 MG tablet Take 1 tablet by mouth every 6 (six) hours as needed for headache or migraine. 09/16/19 09/15/20 Yes Arvilla Market, DO  cetirizine (ZYRTEC) 10 MG tablet Take 1 tablet (10 mg total) by mouth daily. 06/13/19  Yes Arvilla Market, DO  fluticasone (FLONASE) 50  MCG/ACT nasal spray Place 2 sprays into both nostrils daily. 04/21/19  Yes Hoy Register, MD  Multiple Vitamin (MULTIVITAMIN) tablet Take 1 tablet by mouth daily.   Yes [provider]    Family History  Problem Relation Age of Onset  . Hyperlipidemia Mother   . Hypertension Paternal Uncle   . Breast cancer Sister 23  . Diabetes Neg Hx   . Stroke Neg Hx     No Known Allergies  Social History   Socioeconomic History  . Marital status: Married    Spouse name: Not on file  . Number of children: Not on file  . Years of education: Not on file  . Highest education level: Not on file  Occupational History  . Not on file  Tobacco Use  . Smoking status: Former Games developer  . Smokeless tobacco: Never Used  Substance and Sexual Activity  . Alcohol use: Yes  . Drug use: Never  . Sexual activity: Yes    Birth control/protection: Surgical  Other Topics Concern  . Not on file  Social History Narrative  . Not on file   Social Determinants of Health   Financial Resource Strain:   . Difficulty of Paying Living Expenses: Not on file  Food Insecurity:   . Worried About Programme researcher, broadcasting/film/video in the Last Year: Not on file  . Ran Out of Food in the Last Year: Not on file  Transportation Needs:   . Lack of Transportation (Medical): Not on file  . Lack of Transportation (Non-Medical): Not on file  Physical Activity:   . Days of Exercise per Week: Not on  file  . Minutes of Exercise per Session: Not on file  Stress:   . Feeling of Stress : Not on file  Social Connections:   . Frequency of Communication with Friends and Family: Not on file  . Frequency of Social Gatherings with Friends and Family: Not on file  . Attends Religious Services: Not on file  . Active Member of Clubs or Organizations: Not on file  . Attends Banker Meetings: Not on file  . Marital Status: Not on file  Intimate Partner Violence:   . Fear of Current or Ex-Partner: Not on file  . Emotionally  Abused: Not on file  . Physically Abused: Not on file  . Sexually Abused: Not on file    Review of Systems Pertinent negatives listed in HPI Past Medical, Surgical Family and Social History reviewed and updated.  Objective:   Today's Vitals   12/06/19 0924  BP: 113/75  Pulse: 66  Resp: 17  Temp: (!) 97.3 F (36.3 C)  TempSrc: Temporal  SpO2: 97%  Weight: 201 lb (91.2 kg)  Height: 5\' 4"  (1.626 m)    Wt Readings from Last 3 Encounters:  12/06/19 201 lb (91.2 kg)  09/30/18 204 lb (92.5 kg)  06/09/18 204 lb 12.8 oz (92.9 kg)    Physical Exam Constitutional: Patient appears well-developed and well-nourished. No distress. HENT: Normocephalic, atraumatic, External right and left ear normal.  Eyes: Conjunctivae and EOM are normal. PERRLA, no scleral icterus. Neck: Normal ROM. Neck supple. No JVD. No tracheal deviation. No thyromegaly. CVS: RRR, S1/S2 +, no murmurs, no gallops, no carotid bruit.  Pulmonary: Effort and breath sounds normal, no stridor, rhonchi, wheezes, rales.  Abdominal: Soft. BS +, no distension, tenderness, rebound or guarding.  Musculoskeletal: Normal range of motion. No edema and no tenderness.  Neuro: Alert. Normal reflexes, muscle tone coordination. No cranial nerve deficit. Skin: Skin is warm and dry. No rash noted. Not diaphoretic. No erythema. No pallor. Psychiatric: Normal mood and affect. Behavior, judgment, thought content normal.   Assessment & Plan:  1. Annual physical exam Age appropriate anticipatory guidance provided   - CBC with Differential - Comprehensive metabolic panel - Lipid Panel  2. Lipid screening - Lipid Panel  3. Screening for metabolic disorder - Comprehensive metabolic panel - POCT URINALYSIS DIP (CLINITEK)  4. Diabetes mellitus screening - Hemoglobin A1c  5. Screening for deficiency anemia - CBC with Differential  6. Screening for STDs (sexually transmitted diseases) - RPR - HIV antibody (with reflex) -  Cervicovaginal ancillary only  7. Need for hepatitis C screening test - HCV Ab w/Rflx to Verification  8. Other fatigue, TSH norma - Vitamin D, 25-hydroxy  9. Rectal bleeding - Ambulatory referral to Gastroenterology  Results will be communicated via My Chart Referral for GI placed.   Return in about 8 weeks (around 01/31/2020) for 8 week TSH and Flu vaccine .   13/11/2019, FNP Primary Care at [facility] [address], [place] [phone]: [phone]

## 2019-12-07 LAB — CBC WITH DIFFERENTIAL/PLATELET
Basophils Absolute: 0 10*3/uL (ref 0.0–0.2)
Basos: 1 %
EOS (ABSOLUTE): 0.1 10*3/uL (ref 0.0–0.4)
Eos: 3 %
Hematocrit: 40.1 % (ref 34.0–46.6)
Hemoglobin: 13.3 g/dL (ref 11.1–15.9)
Immature Grans (Abs): 0 10*3/uL (ref 0.0–0.1)
Immature Granulocytes: 1 %
Lymphocytes Absolute: 2.1 10*3/uL (ref 0.7–3.1)
Lymphs: 50 %
MCH: 29.9 pg (ref 26.6–33.0)
MCHC: 33.2 g/dL (ref 31.5–35.7)
MCV: 90 fL (ref 79–97)
Monocytes Absolute: 0.4 10*3/uL (ref 0.1–0.9)
Monocytes: 9 %
Neutrophils Absolute: 1.5 10*3/uL (ref 1.4–7.0)
Neutrophils: 36 %
Platelets: 283 10*3/uL (ref 150–450)
RBC: 4.45 x10E6/uL (ref 3.77–5.28)
RDW: 12.4 % (ref 11.7–15.4)
WBC: 4.1 10*3/uL (ref 3.4–10.8)

## 2019-12-07 LAB — COMPREHENSIVE METABOLIC PANEL
ALT: 12 IU/L (ref 0–32)
AST: 14 IU/L (ref 0–40)
Albumin/Globulin Ratio: 1.3 (ref 1.2–2.2)
Albumin: 4.5 g/dL (ref 3.8–4.8)
Alkaline Phosphatase: 79 IU/L (ref 44–121)
BUN/Creatinine Ratio: 12 (ref 9–23)
BUN: 8 mg/dL (ref 6–20)
Bilirubin Total: 0.2 mg/dL (ref 0.0–1.2)
CO2: 23 mmol/L (ref 20–29)
Calcium: 9.1 mg/dL (ref 8.7–10.2)
Chloride: 103 mmol/L (ref 96–106)
Creatinine, Ser: 0.67 mg/dL (ref 0.57–1.00)
GFR calc Af Amer: 128 mL/min/{1.73_m2} (ref >59)
GFR calc non Af Amer: 111 mL/min/{1.73_m2} (ref >59)
Globulin, Total: 3.4 g/dL (ref 1.5–4.5)
Glucose: 92 mg/dL (ref 65–99)
Potassium: 4.3 mmol/L (ref 3.5–5.2)
Sodium: 140 mmol/L (ref 134–144)
Total Protein: 7.9 g/dL (ref 6.0–8.5)

## 2019-12-07 LAB — CERVICOVAGINAL ANCILLARY ONLY
Bacterial Vaginitis (gardnerella): NEGATIVE
Candida Glabrata: NEGATIVE
Candida Vaginitis: NEGATIVE
Chlamydia: NEGATIVE
Comment: NEGATIVE
Comment: NEGATIVE
Comment: NEGATIVE
Comment: NEGATIVE
Comment: NEGATIVE
Comment: NORMAL
Neisseria Gonorrhea: NEGATIVE
Trichomonas: NEGATIVE

## 2019-12-07 LAB — RPR: RPR Ser Ql: NONREACTIVE

## 2019-12-07 LAB — HEMOGLOBIN A1C
Est. average glucose Bld gHb Est-mCnc: 103 mg/dL
Hgb A1c MFr Bld: 5.2 % (ref 4.8–5.6)

## 2019-12-07 LAB — VITAMIN D 25 HYDROXY (VIT D DEFICIENCY, FRACTURES): Vit D, 25-Hydroxy: 13.3 ng/mL — ABNORMAL LOW (ref 30.0–100.0)

## 2019-12-07 LAB — HCV AB W/RFLX TO VERIFICATION: HCV Ab: <0.1 s/co ratio (ref 0.0–0.9)

## 2019-12-07 LAB — LIPID PANEL
Chol/HDL Ratio: 4.9 ratio — ABNORMAL HIGH (ref 0.0–4.4)
Cholesterol, Total: 183 mg/dL (ref 100–199)
HDL: 37 mg/dL — ABNORMAL LOW (ref >39)
LDL Chol Calc (NIH): 101 mg/dL — ABNORMAL HIGH (ref 0–99)
Triglycerides: 261 mg/dL — ABNORMAL HIGH (ref 0–149)
VLDL Cholesterol Cal: 45 mg/dL — ABNORMAL HIGH (ref 5–40)

## 2019-12-07 LAB — HCV INTERPRETATION

## 2019-12-07 LAB — HIV ANTIBODY (ROUTINE TESTING W REFLEX): HIV Screen 4th Generation wRfx: NONREACTIVE

## 2019-12-11 ENCOUNTER — Other Ambulatory Visit: Payer: Self-pay

## 2019-12-11 ENCOUNTER — Encounter: Payer: Self-pay | Admitting: Emergency Medicine

## 2019-12-11 ENCOUNTER — Emergency Department: Payer: Medicaid Other

## 2019-12-11 DIAGNOSIS — Z79899 Other long term (current) drug therapy: Secondary | ICD-10-CM | POA: Insufficient documentation

## 2019-12-11 DIAGNOSIS — Z87891 Personal history of nicotine dependence: Secondary | ICD-10-CM | POA: Insufficient documentation

## 2019-12-11 DIAGNOSIS — R002 Palpitations: Secondary | ICD-10-CM | POA: Insufficient documentation

## 2019-12-11 LAB — TROPONIN I (HIGH SENSITIVITY)
Troponin I (High Sensitivity): <2 ng/L (ref <18)
Troponin I (High Sensitivity): <2 ng/L (ref <18)

## 2019-12-11 LAB — BASIC METABOLIC PANEL
Anion gap: 10 (ref 5–15)
BUN: 11 mg/dL (ref 6–20)
CO2: 27 mmol/L (ref 22–32)
Calcium: 8.8 mg/dL — ABNORMAL LOW (ref 8.9–10.3)
Chloride: 100 mmol/L (ref 98–111)
Creatinine, Ser: 0.76 mg/dL (ref 0.44–1.00)
GFR calc Af Amer: >60 mL/min (ref >60)
GFR calc non Af Amer: >60 mL/min (ref >60)
Glucose, Bld: 119 mg/dL — ABNORMAL HIGH (ref 70–99)
Potassium: 4.1 mmol/L (ref 3.5–5.1)
Sodium: 137 mmol/L (ref 135–145)

## 2019-12-11 LAB — CBC
HCT: 37.3 % (ref 36.0–46.0)
Hemoglobin: 13.1 g/dL (ref 12.0–15.0)
MCH: 30.5 pg (ref 26.0–34.0)
MCHC: 35.1 g/dL (ref 30.0–36.0)
MCV: 86.7 fL (ref 80.0–100.0)
Platelets: 295 10*3/uL (ref 150–400)
RBC: 4.3 MIL/uL (ref 3.87–5.11)
RDW: 11.9 % (ref 11.5–15.5)
WBC: 5.7 10*3/uL (ref 4.0–10.5)
nRBC: 0 % (ref 0.0–0.2)

## 2019-12-11 NOTE — ED Triage Notes (Addendum)
Here for palpitation like feeling in chest. Denies SHOB.  Was at doctor this week for physical and did not have these symptom.  Unlabored at this time. No fever or cough. VSS. Will feel likes takes breath away for a minute.

## 2019-12-12 ENCOUNTER — Emergency Department
Admission: EM | Admit: 2019-12-12 | Discharge: 2019-12-12 | Disposition: A | Discharge status: Home / Self Care | Payer: Medicaid Other | Attending: Emergency Medicine | Admitting: Emergency Medicine

## 2019-12-12 DIAGNOSIS — R002 Palpitations: Secondary | ICD-10-CM

## 2019-12-12 NOTE — ED Provider Notes (Signed)
[facility] Emergency Department Provider Note  ____________________________________________   First MD Initiated Contact with Patient 12/12/19 0019     (approximate)  I have reviewed the triage vital signs and the nursing notes.   HISTORY  Chief Complaint Palpitations   HPI Sandy Berger is a 39 y.o. female with a past medical history of environmental allergies and hypothyroidism not currently on any thyroid medications who presents for assessment of some palpitations she fell earlier today which have since resolved.  Patient states she has these on and off intermittently over the last several months.  She states he saw her PCP within last week and everything was fine then.  She denies any clear precipitating events but does note she drinks 1 to 2 cups of caffeine per day and took an over-the-counter weight loss supplement a couple of days ago.  She denies tobacco abuse, EtOH abuse, illicit drug use.  She denies any estrogen supplementation.  She states that she never has had any chest pain, cough, shortness of breath, headache, earache, sore throat, nausea, vomiting, diarrhea, dysuria, Donnell pain, back pain, rash, extremity pain, or any other associated symptoms.  No clear alleviating aggravating factors.  Patient states she is currently symptomatic.         Past Medical History:  Diagnosis Date  . Allergy   . Hyperthyroidism   . Thyroid disease    [employer]reesia 12/03/2019    Patient Active Problem List   Diagnosis Date Noted  . Hyperthyroidism 09/16/2019  . Allergy   . Urinary incontinence in female 12/15/2018  . Vaginal discharge 09/30/2018  . Pelvic pain 09/30/2018    Past Surgical History:  Procedure Laterality Date  . CESAREAN SECTION    . TUBAL LIGATION N/A    [employer]reesia 12/03/2019    Prior to Admission medications   Medication Sig Start Date End Date Taking? Authorizing Provider  butalbital-acetaminophen-caffeine (FIORICET)  50-325-40 MG tablet Take 1 tablet by mouth every 6 (six) hours as needed for headache or migraine. 09/16/19 09/15/20  Arvilla Market, DO  cetirizine (ZYRTEC) 10 MG tablet Take 1 tablet (10 mg total) by mouth daily. 06/13/19   Arvilla Market, DO  fluticasone (FLONASE) 50 MCG/ACT nasal spray Place 2 sprays into both nostrils daily. 04/21/19   Hoy Register, MD  Multiple Vitamin (MULTIVITAMIN) tablet Take 1 tablet by mouth daily.    [provider]    Allergies Patient has no known allergies.  Family History  Problem Relation Age of Onset  . Hyperlipidemia Mother   . Hypertension Paternal Uncle   . Breast cancer Sister 60  . Diabetes Neg Hx   . Stroke Neg Hx     Social History Social History   Tobacco Use  . Smoking status: Former Games developer  . Smokeless tobacco: Never Used  Substance Use Topics  . Alcohol use: Yes  . Drug use: Never    Review of Systems  Review of Systems  Constitutional: Negative for chills and fever.  HENT: Negative for sore throat.   Eyes: Negative for pain.  Respiratory: Negative for cough and stridor.   Cardiovascular: Positive for palpitations. Negative for chest pain.  Gastrointestinal: Negative for vomiting.  Skin: Negative for rash.  Neurological: Negative for seizures, loss of consciousness and headaches.  Psychiatric/Behavioral: Negative for suicidal ideas.  All other systems reviewed and are negative.     ____________________________________________   PHYSICAL EXAM:  VITAL SIGNS: ED Triage Vitals  Enc Vitals Group  BP 12/11/19 1657 (!) 149/80     Pulse Rate 12/11/19 1657 86     Resp 12/11/19 1657 18     Temp 12/11/19 1657 98.6 F (37 C)     Temp Source 12/11/19 1657 Oral     SpO2 12/11/19 1657 98 %     Weight 12/11/19 1658 201 lb (91.2 kg)     Height 12/11/19 1658 5\' 1"  (1.549 m)     Head Circumference --      Peak Flow --      Pain Score 12/11/19 1658 0     Pain Loc --      Pain Edu? --       Excl. in GC? --    Vitals:   12/11/19 1657 12/11/19 2129  BP: (!) 149/80 121/67  Pulse: 86 67  Resp: 18 18  Temp: 98.6 F (37 C) 98 F (36.7 C)  SpO2: 98% 99%   Physical Exam Vitals and nursing note reviewed.  Constitutional:      General: She is not in acute distress.    Appearance: She is well-developed.  HENT:     Head: Normocephalic and atraumatic.     Right Ear: External ear normal.     Left Ear: External ear normal.     Nose: Nose normal.     Mouth/Throat:     Mouth: Mucous membranes are moist.  Eyes:     Conjunctiva/sclera: Conjunctivae normal.  Cardiovascular:     Rate and Rhythm: Normal rate and regular rhythm.     Pulses: Normal pulses.     Heart sounds: No murmur heard.   Pulmonary:     Effort: Pulmonary effort is normal. No respiratory distress.     Breath sounds: Normal breath sounds.  Abdominal:     Palpations: Abdomen is soft.     Tenderness: There is no abdominal tenderness.  Musculoskeletal:        General: No deformity.     Cervical back: Neck supple.  Skin:    General: Skin is warm and dry.     Capillary Refill: Capillary refill takes less than 2 seconds.  Neurological:     Mental Status: She is alert and oriented to person, place, and time.  Psychiatric:        Mood and Affect: Mood normal.      ____________________________________________   LABS (all labs ordered are listed, but only abnormal results are displayed)  Labs Reviewed  BASIC METABOLIC PANEL - Abnormal; Notable for the following components:      Result Value   Glucose, Bld 119 (*)    Calcium 8.8 (*)    All other components within normal limits  CBC  POC URINE PREG, ED  TROPONIN I (HIGH SENSITIVITY)  TROPONIN I (HIGH SENSITIVITY)   ____________________________________________  EKG  Sinus rhythm with a ventricular rate of 76, normal axis, unremarkable intervals, no clear evidence of acute ischemia. ____________________________________________  RADIOLOGY  ED MD  interpretation: No evidence of pneumonia, thorax, edema, effusion, or other acute intrathoracic process.  Official radiology report(s): DG Chest 2 View  Result Date: 12/11/2019 CLINICAL DATA:  Shortness of breath. EXAM: CHEST - 2 VIEW COMPARISON:  None. FINDINGS: The heart size and mediastinal contours are within normal limits. Both lungs are clear. The visualized skeletal structures are unremarkable. IMPRESSION: No active cardiopulmonary disease. Electronically Signed   By: 12/13/2019 III M.D   On: 12/11/2019 18:10    ____________________________________________   PROCEDURES  Procedure(s) performed (including Critical  Care):  Procedures   ____________________________________________   INITIAL IMPRESSION / ASSESSMENT AND PLAN / ED COURSE        Patient presents for assessment of palpitations experienced earlier today which were normal for last several months.  Patient is afebrile hemodynamically stable on arrival.  Exam as above.  Differential includes symptomatic hyper thyroidism, PE, ACS, arrhythmia, symptomatic anemia, metabolic derangement, heart failure, and pneumonia.  No fever or elevation of white blood cell or findings on chest x-ray to suggest pneumonia.  Patient is not appear volume overloaded on exam or chest x-ray and has no history of heart failure.  EKG does not show evidence of arrhythmia, suspicion for ACS given patient denies any chest pain or note acute ischemic changes on EKG and to nonelevated troponins were obtained over 2 hours.  In addition CBC is unremarkable and not consistent with acute symptomatic anemia.  BMP unremarkable without evidence of significant metabolic derangement.  Low suspicion for PE as patient is PERC negative.  Patient is not tachycardic, diaphoretic she has no other current symptoms to suggest thyrotoxicosis at this time.  I did discuss with the patient that her symptoms may be related to her thyroid and that she should have this  rechecked by her PCP.  Also advised patient to decrease her caffeine and avoid over-the-counter dietary supplements.  Given stable vital signs with patient asymptomatic and otherwise reassuring work-up I do believe she is safe for discharge with plan for continued outpatient work-up with her PCP.  Patient discharged stable condition.  Strict return precautions advised and discussed. ____________________________________________   FINAL CLINICAL IMPRESSION(S) / ED DIAGNOSES  Final diagnoses:  Palpitations    Medications - No data to display   ED Discharge Orders    None       Note:  This document was prepared using Dragon voice recognition software and may include unintentional dictation errors.   Gilles Chiquito, MD 12/12/19 [phone]

## 2019-12-13 ENCOUNTER — Telehealth: Payer: Self-pay

## 2019-12-13 NOTE — Telephone Encounter (Signed)
Transition Care Management Follow-up Telephone Call  Date of discharge and from where: [facility] 12/12/2019  How have you been since you were released from the hospital? Doing well, no more episodes today with palpitations  Any questions or concerns? No  Items Reviewed:  Did the pt receive and understand the discharge instructions provided? Yes   Medications obtained and verified? Yes   Any new allergies since your discharge? No   Dietary orders reviewed? Yes  Do you have support at home? Yes   Functional Questionnaire: (I = Independent and D = Dependent) ADLs: D  Bathing/Dressing- D    Meal Prep- D  Eating- D  Maintaining continence- D  Transferring/Ambulation- D  Managing Meds- D  Follow up appointments reviewed:   PCP Hospital f/u appt confirmed? No    Specialist Hospital f/u appt confirmed? No    Are transportation arrangements needed? No   If their condition worsens, is the pt aware to call PCP or go to the Emergency Dept.? Yes  Was the patient provided with contact information for the PCP's office or ED? Yes  Was to pt encouraged to call back with questions or concerns? Yes  TA/CMA

## 2019-12-14 ENCOUNTER — Other Ambulatory Visit: Payer: Self-pay | Admitting: Family Medicine

## 2019-12-14 MED ORDER — VITAMIN D (ERGOCALCIFEROL) 1.25 MG (50000 UNIT) PO CAPS: 50000.0000 [IU] | ORAL_CAPSULE | ORAL | 0 refills | Status: AC

## 2019-12-14 NOTE — Progress Notes (Signed)
Vitamin D 50,000 IU weekly x 12 week, deficiency per recent labs

## 2019-12-22 ENCOUNTER — Encounter: Payer: Self-pay | Admitting: Internal Medicine

## 2020-02-02 ENCOUNTER — Ambulatory Visit: Payer: Medicaid Other | Admitting: Internal Medicine

## 2020-02-03 ENCOUNTER — Other Ambulatory Visit: Payer: Medicaid Other

## 2020-02-06 ENCOUNTER — Ambulatory Visit: Payer: Medicaid Other | Admitting: Internal Medicine

## 2020-10-07 IMAGING — MG DIGITAL SCREENING BILATERAL MAMMOGRAM WITH CAD
4 series · 4 of 4 positions shown · non-contrast
Comparison: None.

CLINICAL DATA: Screening.

EXAM:
DIGITAL SCREENING BILATERAL MAMMOGRAM WITH CAD

[R CC]
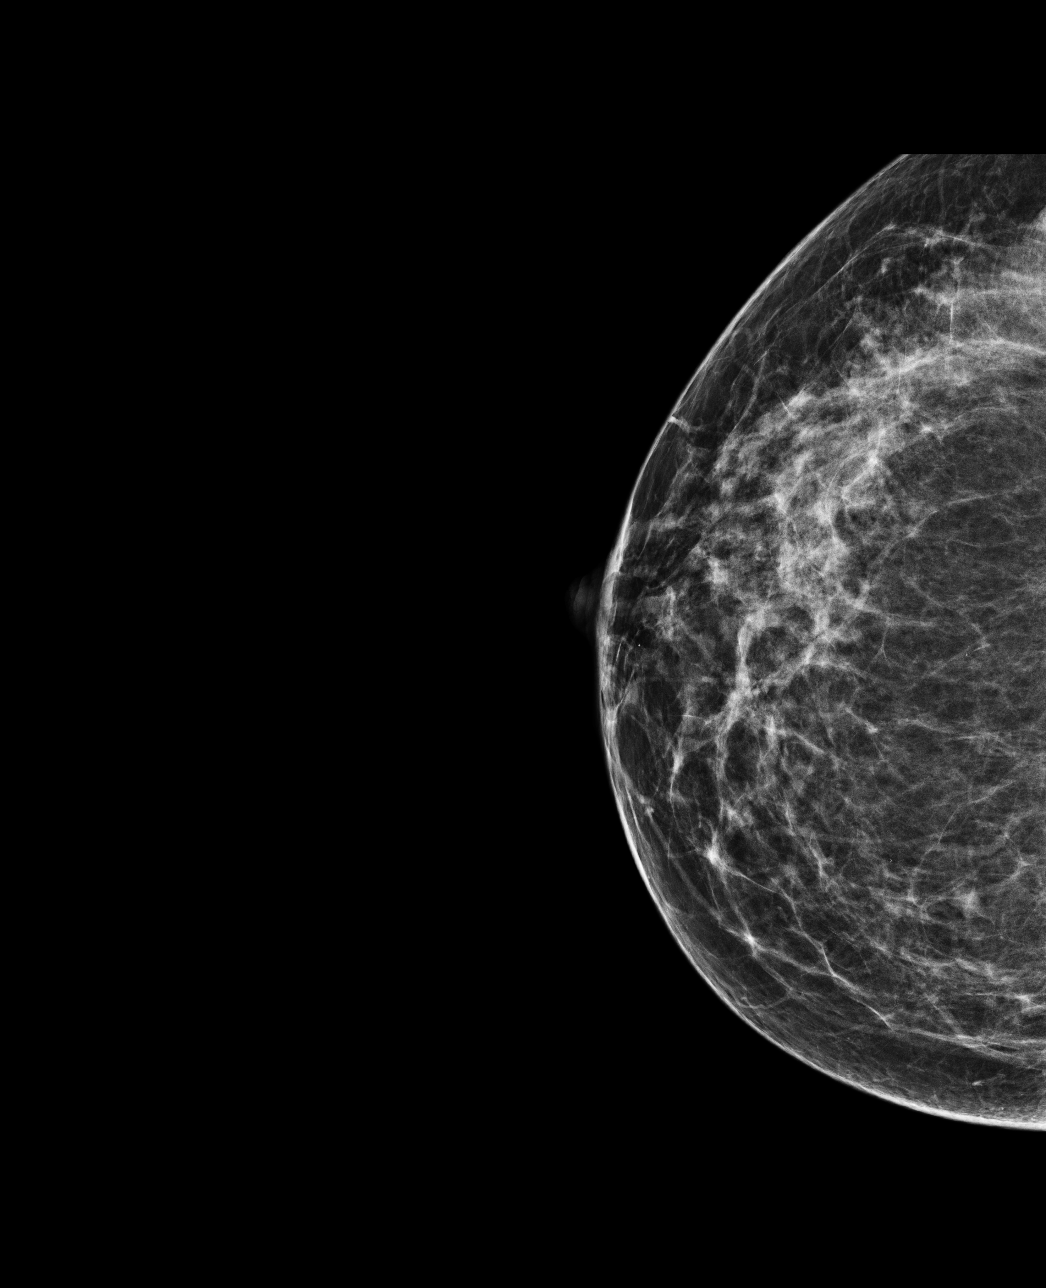

[L MLO]
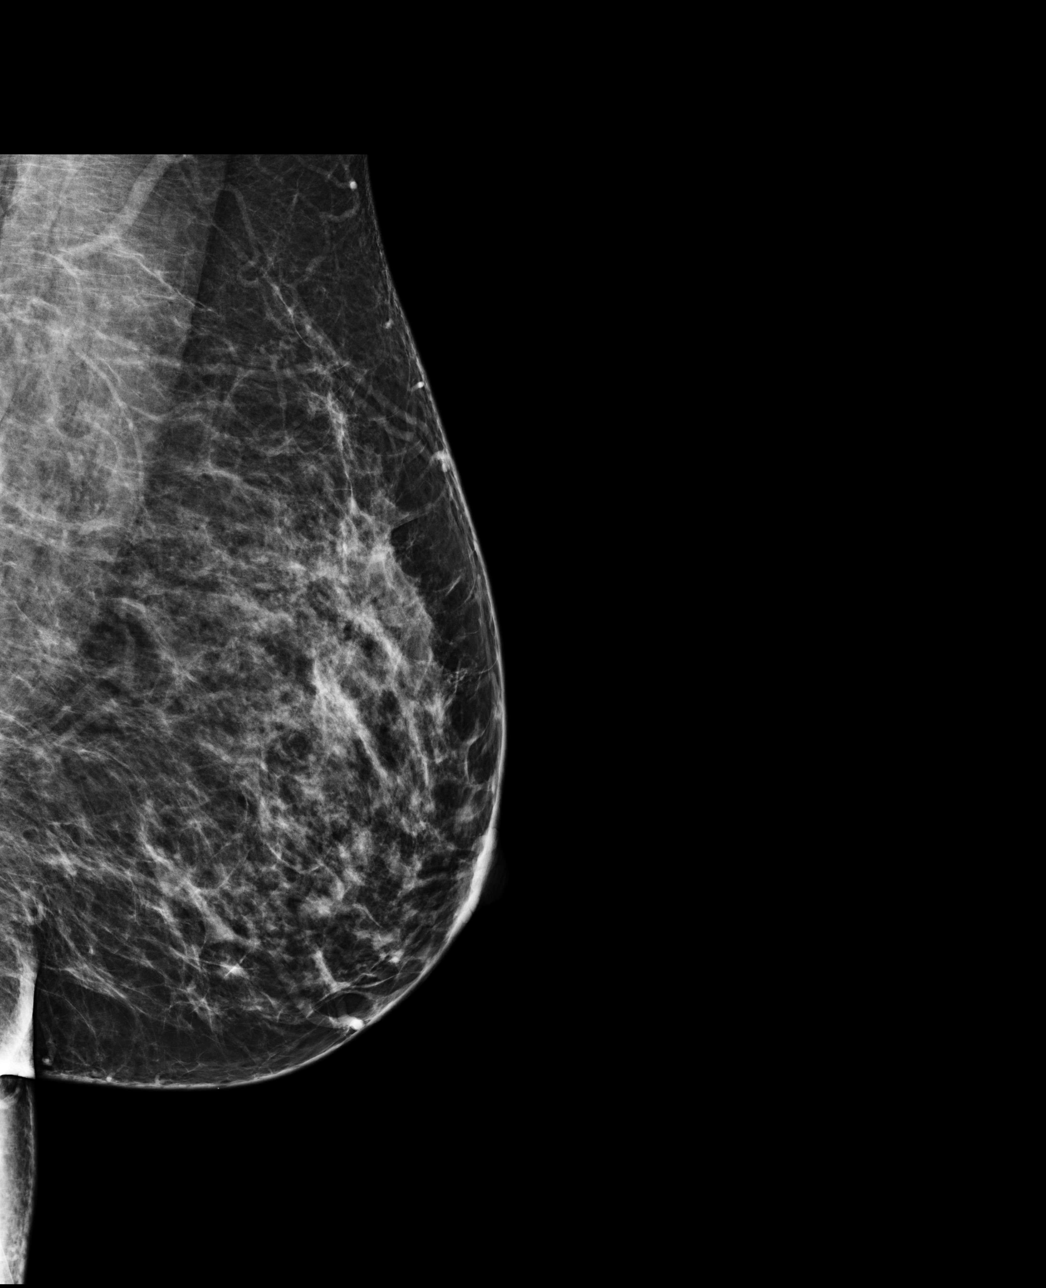

[L CC]
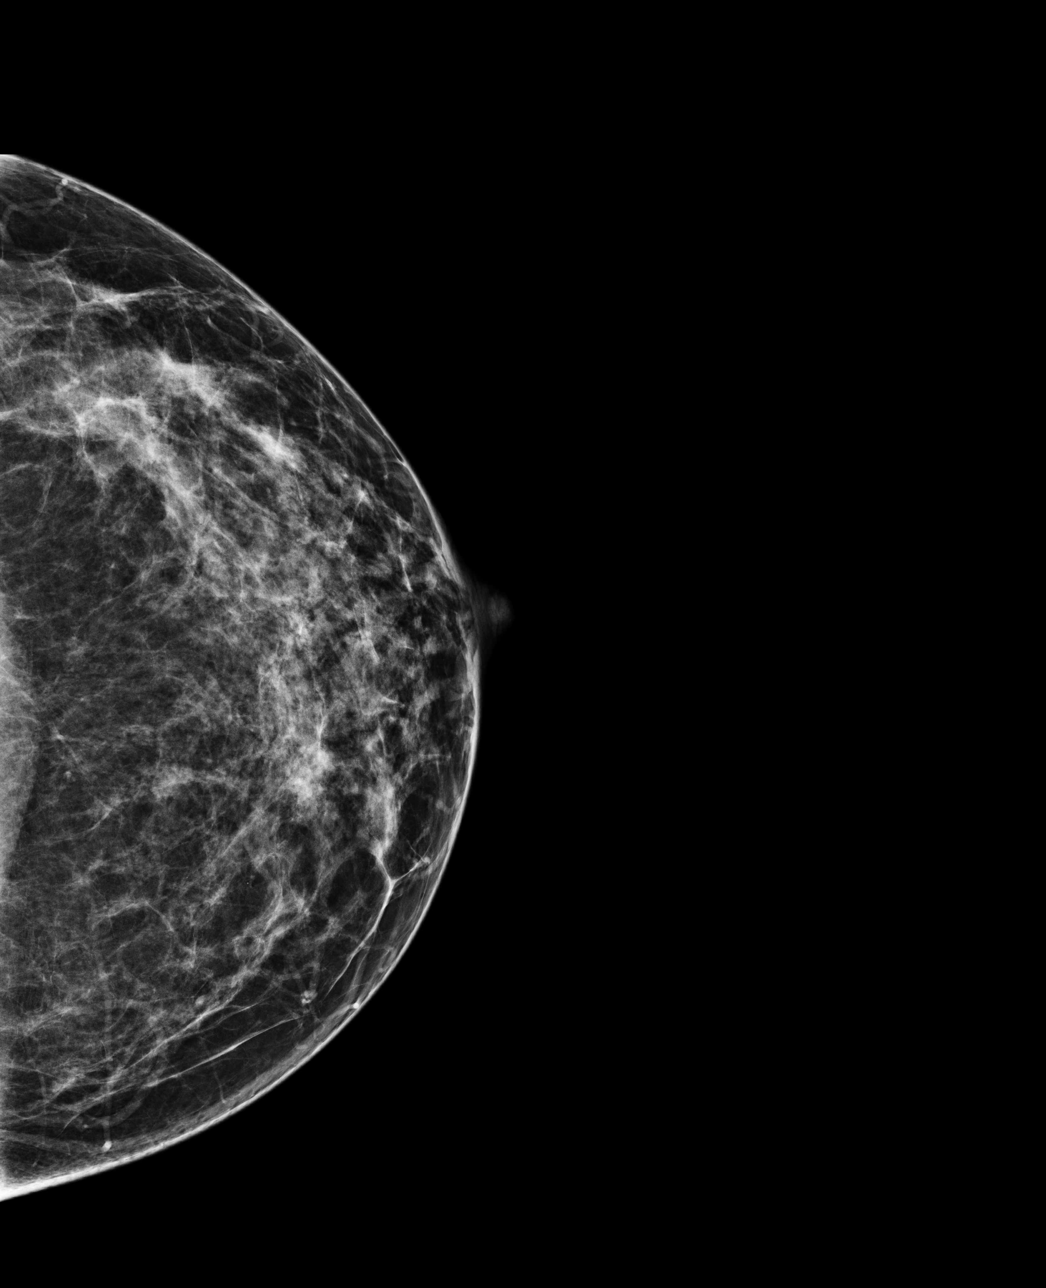

[R MLO]
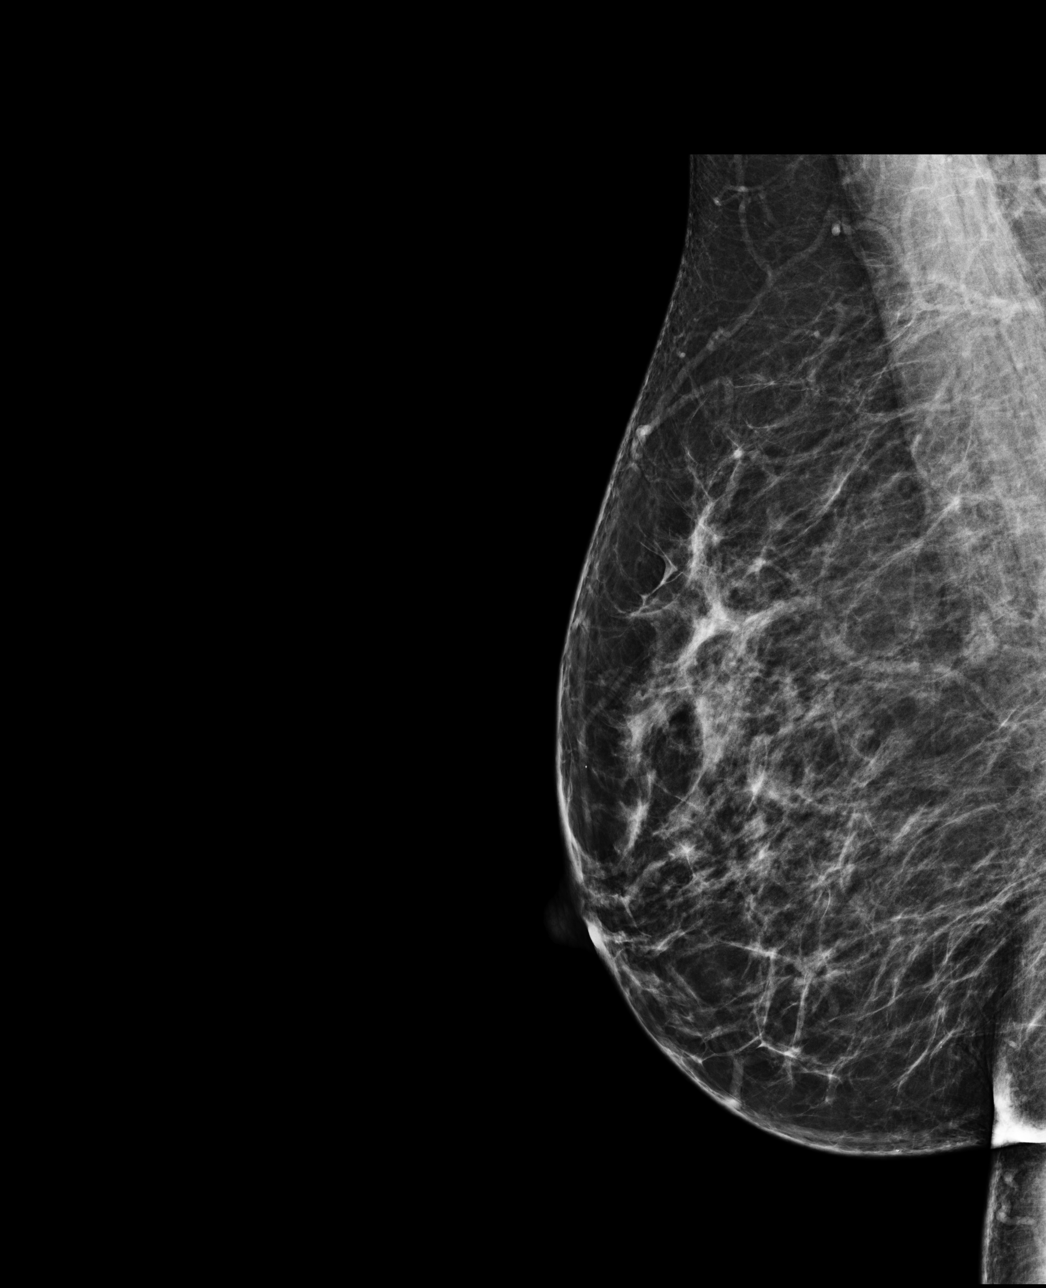

[4 of 4 positions shown; findings below may reference images not displayed]

ACR Breast Density Category c: The breast tissue is heterogeneously
dense, which may obscure small masses
FINDINGS: There are no findings suspicious for malignancy. Images were
processed with CAD.
IMPRESSION: No mammographic evidence of malignancy. A result letter of this
screening mammogram will be mailed directly to the patient.

RECOMMENDATION:
Screening mammogram at age 40. (Code:[id number])

BI-RADS CATEGORY  1: Negative.

## 2021-02-16 IMAGING — US TRANSVAGINAL ULTRASOUND OF PELVIS
1 series · 15 of 25 positions shown · non-contrast
Comparison: None

CLINICAL DATA: Pelvic pain LEFT greater than RIGHT

EXAM:
ULTRASOUND PELVIS TRANSVAGINAL
TECHNIQUE: Transvaginal ultrasound examination of the pelvis was performed
including evaluation of the uterus, ovaries, adnexal regions, and
pelvic cul-de-sac.

[Series 1: transvaginal ultrasound of pelvis · 15 of 42 slices shown]
[im 1/42]
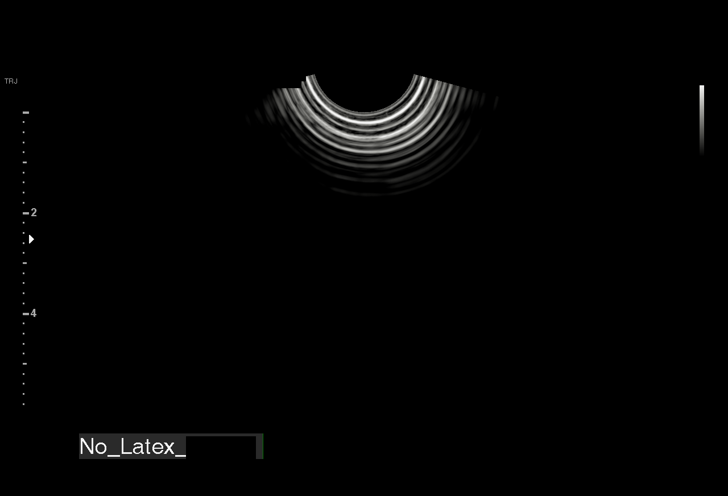
[im 4/42]
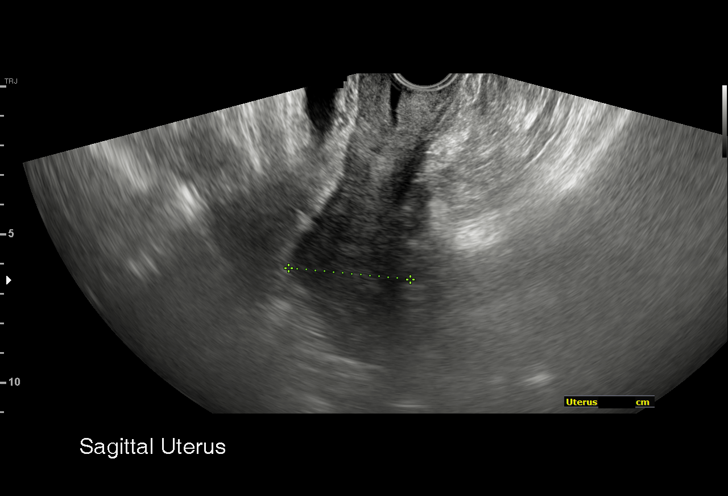
[im 7/42]
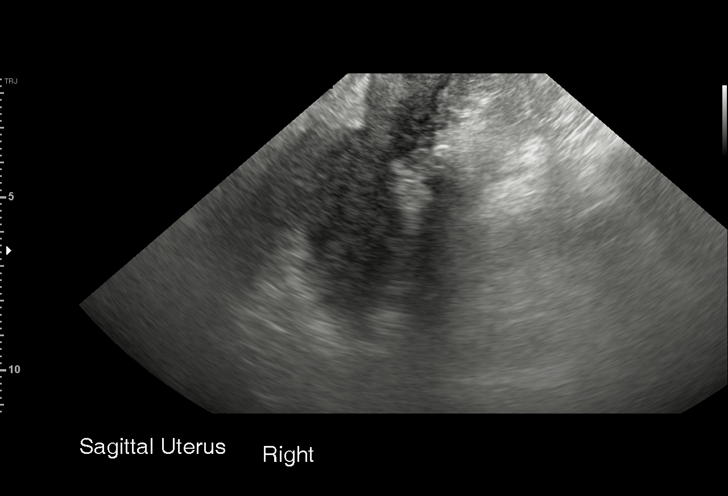
[im 9/42]
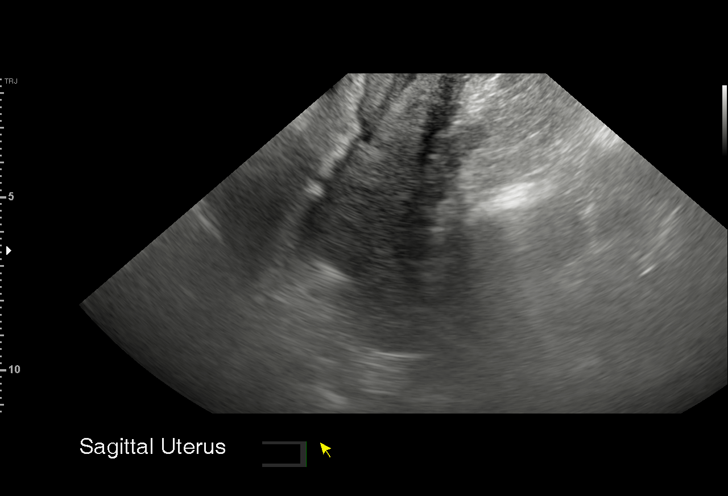
[im 12/42]
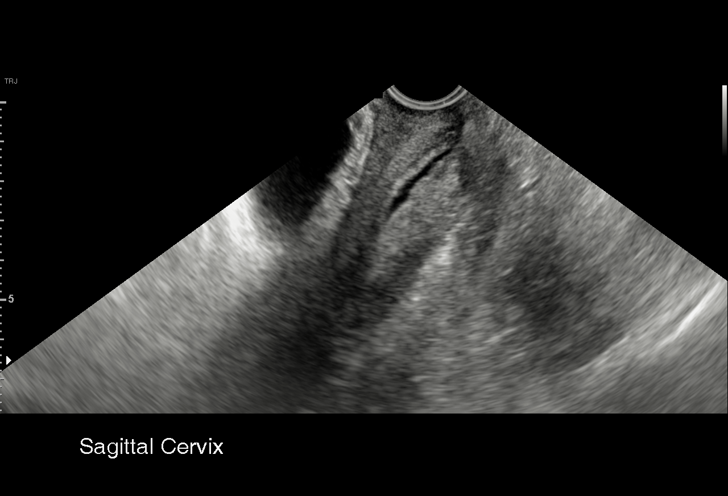
[im 16/42]
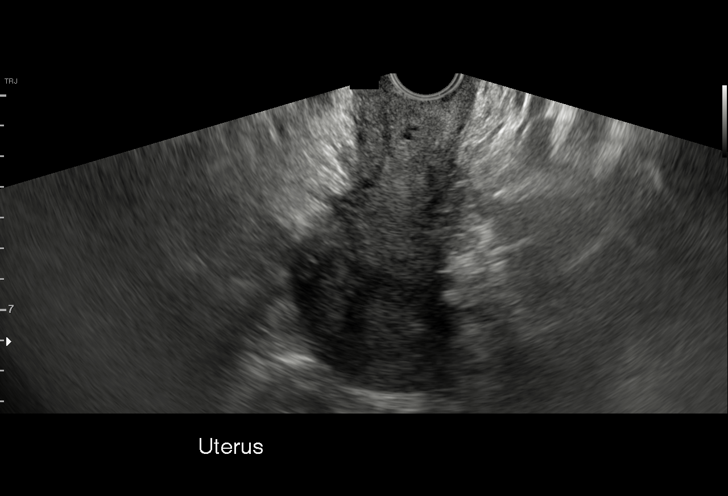
[im 18/42]
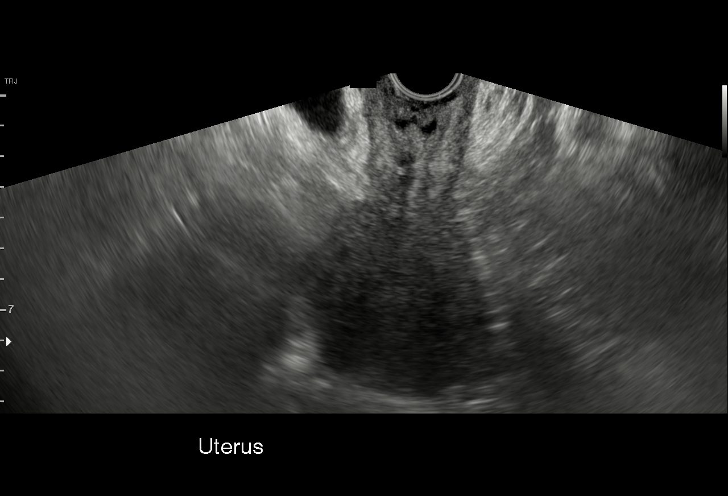
[im 21/42]
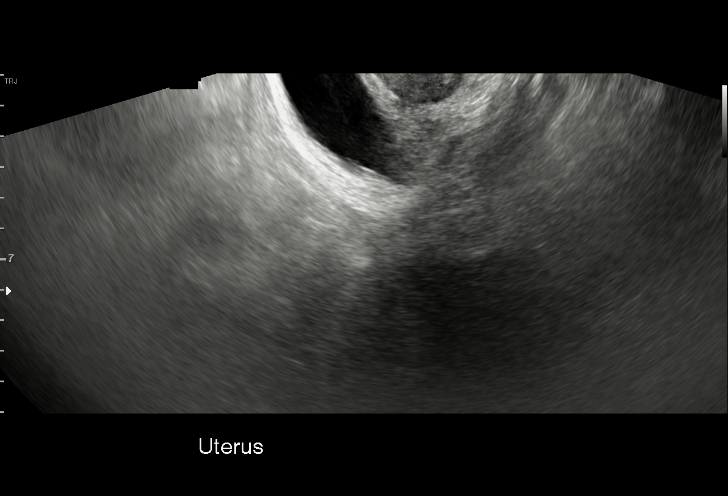
[im 24/42]
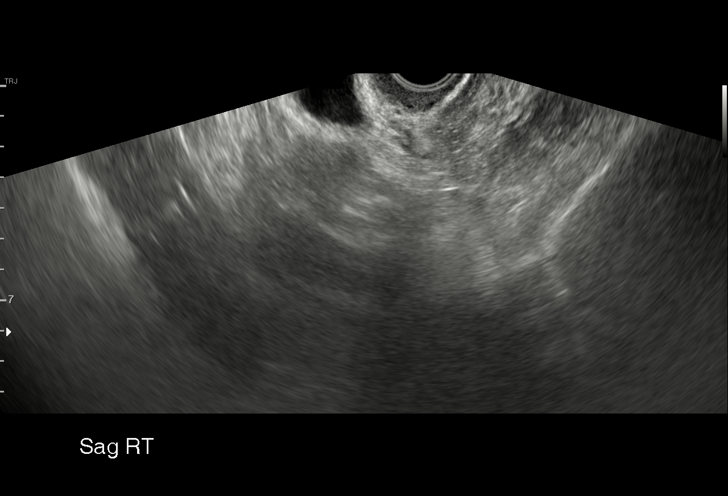
[im 26/42]
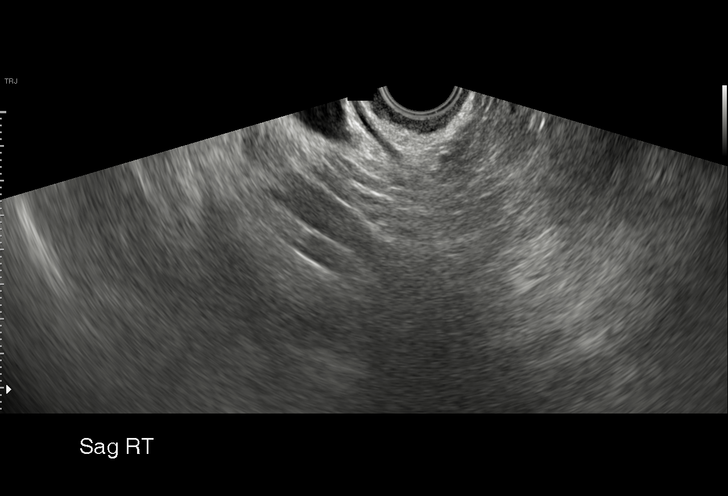
[im 30/42]
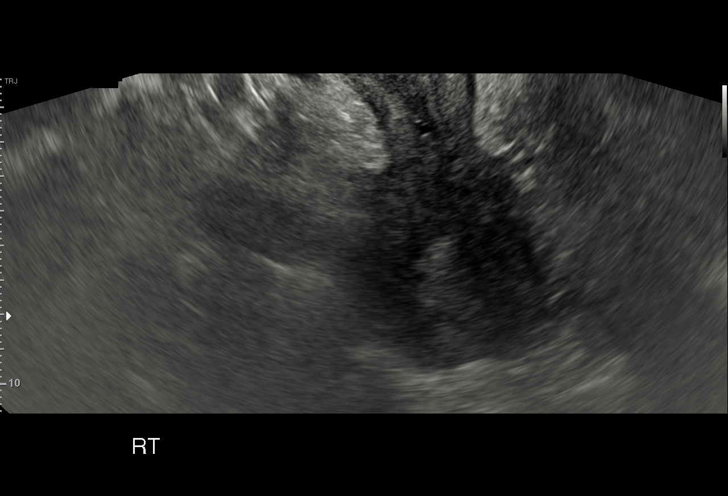
[im 33/42]
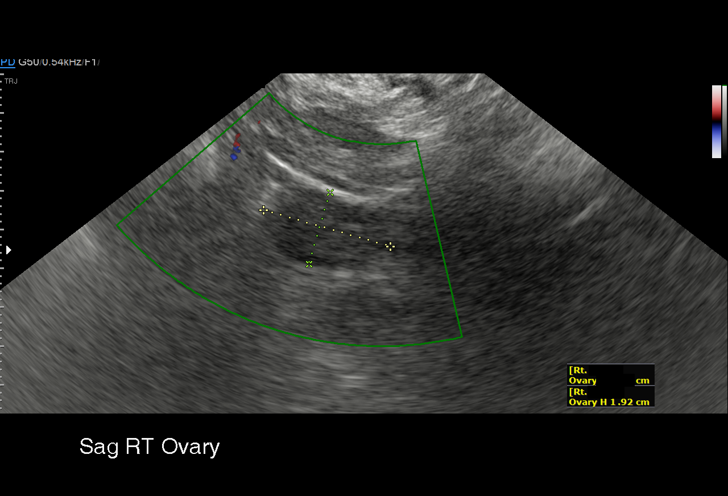
[im 35/42]
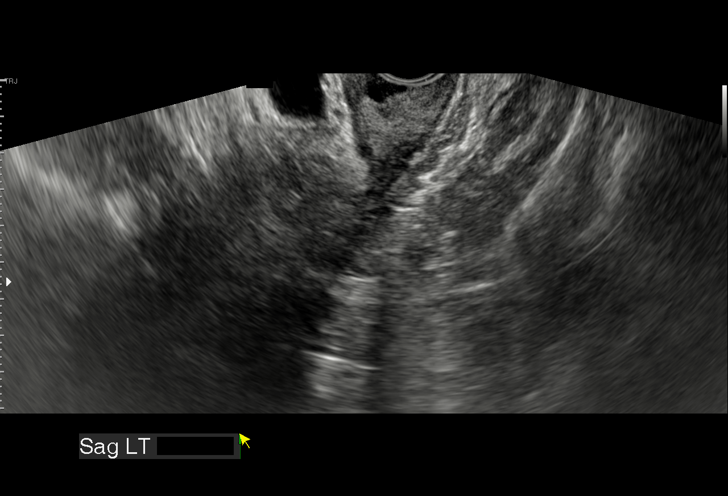
[im 38/42]
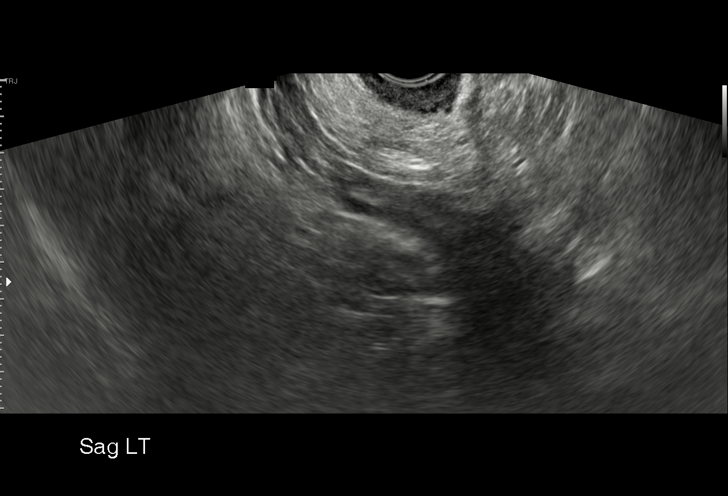
[im 42/42]
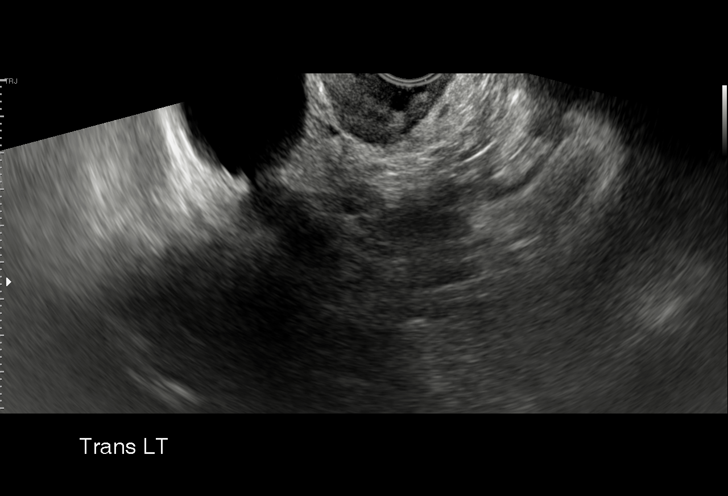

[15 of 25 positions shown; findings below may reference images not displayed]

FINDINGS: Uterus

Measurements: 9.5 x 4.1 x 5.4 cm = volume: 110 mL. Retroflexed.
Fundus and upper uterine segment suboptimally visualized. No
definite focal mass

Endometrium

Thickness: 9 mm, normal. No endometrial fluid or focal abnormality.
Small amount of nonspecific endocervical fluid.

Right ovary

Measurements: 3.4 x 1.9 x 3.0 cm = volume: 10.1 mL. Normal
morphology without mass

Left ovary

Not visualized on either transabdominal or endovaginal imaging,
likely obscured by bowel

Other findings:  No free pelvic fluid.  No pelvic masses identified.
IMPRESSION: Nonvisualization of LEFT ovary.

No pelvic sonographic abnormalities identified.

## 2021-07-03 ENCOUNTER — Emergency Department (HOSPITAL_BASED_OUTPATIENT_CLINIC_OR_DEPARTMENT_OTHER)
Admission: EM | Admit: 2021-07-03 | Discharge: 2021-07-03 | Disposition: A | Discharge status: Home / Self Care | Payer: Medicaid Other | Attending: Emergency Medicine | Admitting: Emergency Medicine

## 2021-07-03 ENCOUNTER — Encounter (HOSPITAL_BASED_OUTPATIENT_CLINIC_OR_DEPARTMENT_OTHER): Payer: Self-pay | Admitting: Urology

## 2021-07-03 ENCOUNTER — Other Ambulatory Visit: Payer: Self-pay

## 2021-07-03 DIAGNOSIS — J069 Acute upper respiratory infection, unspecified: Secondary | ICD-10-CM | POA: Insufficient documentation

## 2021-07-03 DIAGNOSIS — Z20822 Contact with and (suspected) exposure to covid-19: Secondary | ICD-10-CM | POA: Insufficient documentation

## 2021-07-03 DIAGNOSIS — Z7951 Long term (current) use of inhaled steroids: Secondary | ICD-10-CM | POA: Insufficient documentation

## 2021-07-03 LAB — RESP PANEL BY RT-PCR (FLU A&B, COVID) ARPGX2
Influenza A by PCR: NEGATIVE
Influenza B by PCR: NEGATIVE
SARS Coronavirus 2 by RT PCR: NEGATIVE

## 2021-07-03 LAB — GROUP A STREP BY PCR: Group A Strep by PCR: NOT DETECTED

## 2021-07-03 NOTE — ED Provider Notes (Signed)
?[facility] EMERGENCY DEPT ?Provider Note ? ? ?CSN: [id number] ?Arrival date & time: 07/03/21  1514 ? ?  ? ?History ? ?Chief Complaint  ?Patient presents with  ? Sore Throat  ? ? ?Sandy Berger is a 41 y.o. female. ? ? ?Sore Throat ? ? ?41 year old female presenting to the emergency department with a sore throat and cough for the past 2 days.  She had a low-grade temperature at home.  She denies any nausea, vomiting, diarrhea.  She did take Motrin at home.  She states that her son has been sick with a URI for the past several days before her.  She denies any difficulty phonating or swallowing.  She denies any neck pain or stiffness.  No difficulty breathing or chest discomfort. ? ?Home Medications ?Prior to Admission medications   ?Medication Sig Start Date End Date Taking? Authorizing Provider  ?cetirizine (ZYRTEC) 10 MG tablet Take 1 tablet (10 mg total) by mouth daily. 06/13/19   Arvilla Market, MD  ?fluticasone Aleda Grana) 50 MCG/ACT nasal spray Place 2 sprays into both nostrils daily. 04/21/19   Hoy Register, MD  ?Multiple Vitamin (MULTIVITAMIN) tablet Take 1 tablet by mouth daily.    [provider]  ?Vitamin D, Ergocalciferol, (DRISDOL) 1.25 MG (50000 UNIT) CAPS capsule Take 1 capsule (50,000 Units total) by mouth every 7 (seven) days. 12/14/19   Bing Neighbors, FNP  ?   ? ?Allergies    ?Patient has no known allergies.   ? ?Review of Systems   ?Review of Systems  ?All other systems reviewed and are negative. ? ?Physical Exam ?Updated Vital Signs ?BP 117/67 (BP Location: Right Arm)   Pulse 94   Temp 98 ?F (36.7 ?C) (Oral)   Resp 20   Ht 5\' 1"  (1.549 m)   Wt 95.3 kg   LMP 07/01/2021 (Exact Date)   SpO2 100%   BMI 39.68 kg/m?  ?Physical Exam ?Vitals and nursing note reviewed.  ?Constitutional:   ?   General: She is not in acute distress. ?   Appearance: She is well-developed.  ?HENT:  ?   Head: Normocephalic and atraumatic.  ?   Mouth/Throat:  ?   Pharynx:  Posterior oropharyngeal erythema present. No oropharyngeal exudate.  ?Eyes:  ?   Conjunctiva/sclera: Conjunctivae normal.  ?Cardiovascular:  ?   Rate and Rhythm: Normal rate and regular rhythm.  ?   Heart sounds: No murmur heard. ?Pulmonary:  ?   Effort: Pulmonary effort is normal. No respiratory distress.  ?   Breath sounds: Normal breath sounds.  ?Abdominal:  ?   Palpations: Abdomen is soft.  ?   Tenderness: There is no abdominal tenderness.  ?Musculoskeletal:     ?   General: No swelling.  ?   Cervical back: Neck supple.  ?Skin: ?   General: Skin is warm and dry.  ?   Capillary Refill: Capillary refill takes less than 2 seconds.  ?Neurological:  ?   Mental Status: She is alert.  ?Psychiatric:     ?   Mood and Affect: Mood normal.  ? ? ?ED Results / Procedures / Treatments   ?Labs ?(all labs ordered are listed, but only abnormal results are displayed) ?Labs Reviewed  ?RESP PANEL BY RT-PCR (FLU A&B, COVID) [id number]  ?GROUP A STREP BY PCR  ? ? ?EKG ?None ? ?Radiology ?No results found. ? ?Procedures ?Procedures  ? ? ?Medications Ordered in ED ?Medications - No data to display ? ?ED Course/ Medical Decision Making/ A&P ?  ?                        ?  Medical Decision Making ? ?41 year old female presenting to the emergency department with a sore throat and cough for the past 2 days.  She had a low-grade temperature at home.  She denies any nausea, vomiting, diarrhea.  She did take Motrin at home.  She states that her son has been sick with a URI for the past several days before her.  She denies any difficulty phonating or swallowing.  She denies any neck pain or stiffness.  No difficulty breathing or chest discomfort. ? ?Sandy Berger is a 41 y.o. female who presents to the ED with a 3 day history of fever, rhinorrhea, and nasal congestion. ? ?On my exam, the patient is well-appearing and well-hydrated.  The patient's lungs are clear to auscultation bilaterally. Additionally, the patient has a soft/non-tender  abdomen, and no oropharyngeal exudates.  There are no signs of meningismus.  I see no signs of an acute bacterial infection. ? ?The patient's presentation is most consistent with a viral upper respiratory infection.  I have a low suspicion for pneumonia as the patient's cough has been non-productive and the patient is neither tachypneic nor hypoxic on room air.  Additionally, the patient is CTAB. ? ?COVID-19 and influenza PCR testing was collected and resulted negative.  Strep screening was performed and also resulted negative.  Given the short duration of symptoms and lungs clear to auscultation, I do not think chest x-ray imaging is warranted at this time. ? ?I discussed symptomatic management, including hydration, motrin, and tylenol. The patient felt safe being discharged from the ED.  They agreed to followup with the PCP if needed.  I provided ED return precautions. ? ? ?Final Clinical Impression(s) / ED Diagnoses ?Final diagnoses:  ?Viral URI with cough  ? ? ?Rx / DC Orders ?ED Discharge Orders   ? ? None  ? ?  ? ? ?  ?Ernie Avena, MD ?07/03/21 1758 ? ?

## 2021-07-03 NOTE — ED Triage Notes (Signed)
Pt states sore throat and cough x 2 days ?Fever at home  ?Denies N/V/D ?Took motrin 1 hr ago  ? ?

## 2021-07-03 NOTE — ED Notes (Signed)
RN provided AVS using Teachback Method. Patient verbalizes understanding of Discharge Instructions. Opportunity for Questioning and Answers were provided by RN. Patient Discharged from ED ambulatory to Home. ° °

## 2021-07-05 ENCOUNTER — Emergency Department (HOSPITAL_BASED_OUTPATIENT_CLINIC_OR_DEPARTMENT_OTHER)
Admission: EM | Admit: 2021-07-05 | Discharge: 2021-07-05 | Disposition: A | Discharge status: Home / Self Care | Payer: Medicaid - Out of State | Attending: Emergency Medicine | Admitting: Emergency Medicine

## 2021-07-05 ENCOUNTER — Other Ambulatory Visit (HOSPITAL_BASED_OUTPATIENT_CLINIC_OR_DEPARTMENT_OTHER): Payer: Self-pay

## 2021-07-05 ENCOUNTER — Encounter (HOSPITAL_BASED_OUTPATIENT_CLINIC_OR_DEPARTMENT_OTHER): Payer: Self-pay | Admitting: Emergency Medicine

## 2021-07-05 ENCOUNTER — Other Ambulatory Visit: Payer: Self-pay

## 2021-07-05 DIAGNOSIS — J029 Acute pharyngitis, unspecified: Secondary | ICD-10-CM

## 2021-07-05 DIAGNOSIS — Z20822 Contact with and (suspected) exposure to covid-19: Secondary | ICD-10-CM | POA: Insufficient documentation

## 2021-07-05 DIAGNOSIS — R051 Acute cough: Secondary | ICD-10-CM | POA: Diagnosis not present

## 2021-07-05 LAB — RESP PANEL BY RT-PCR (FLU A&B, COVID) ARPGX2
Influenza A by PCR: NEGATIVE
Influenza B by PCR: NEGATIVE
SARS Coronavirus 2 by RT PCR: NEGATIVE

## 2021-07-05 LAB — GROUP A STREP BY PCR: Group A Strep by PCR: NOT DETECTED

## 2021-07-05 MED ORDER — LIDOCAINE VISCOUS HCL 2 % MT SOLN
15.0000 mL | Freq: Four times a day (QID) | OROMUCOSAL | 0 refills | Status: AC | PRN
  Filled 2021-07-05: qty 100, 2d supply, fill #0

## 2021-07-05 MED ORDER — LIDOCAINE VISCOUS HCL 2 % MT SOLN
15.0000 mL | Freq: Once | OROMUCOSAL | Status: AC
  Administered 2021-07-05: 15 mL via OROMUCOSAL
  Filled 2021-07-05: qty 15

## 2021-07-05 NOTE — Discharge Instructions (Addendum)
You came to the emergency department today to be evaluated for your sore throat and cough.  Your physical exam was reassuring.  You were negative for COVID-19 and influenza.  Your strep test is currently pending.  Please logon to your [facility] MyChart later this evening to see if you were positive.  If positive I will place a prescription for antibiotics for you to pick up tomorrow.  I have given you prescription for viscous lidocaine, you may use this every 6 hours to help with your sore throat.  Additionally you may take Tylenol and ibuprofen to help with your sore throat.  Please follow-up with your primary care doctor if your symptoms or not improving. ? ?Get help right away if: ?You have difficulty breathing. ?You cannot swallow fluids, soft foods, or your saliva. ?You have increased swelling in your throat or neck. ?You have persistent nausea and vomiting. ?

## 2021-07-05 NOTE — ED Notes (Signed)
EMT-P provided AVS using Teachback Method. Patient verbalizes understanding of Discharge Instructions. Opportunity for Questioning and Answers were provided by EMT-P. Patient Discharged from ED.  ? ?

## 2021-07-05 NOTE — ED Triage Notes (Signed)
Patient c/o sore throat x 4 days. States she was here on the 12th and had a negative strep test but pain has worsened.  ?

## 2021-07-05 NOTE — ED Provider Notes (Signed)
?[facility] ?Provider Note ? ? ?CSN: [id number] ?Arrival date & time: 07/05/21  1245 ? ?  ? ?History ? ?Chief Complaint  ?Patient presents with  ? Sore Throat  ? ? ?Sandy Berger is a 41 y.o. female with pertinent history of seasonal allergies, and thyroid disease.  Presents to the emergency department with a chief complaint of sore throat and cough.  Patient states that her sore throat and cough have been present since 4/10.  Patient states that her sore throat has gotten progressively worse.  Patient states that pain is worse with p.o. intake.  Patient states that her cough is producing mucus occasionally.  When mucus is produced she describes as green in color.  Patient states that her son is sick with similar symptoms. ? ?Patient denies any fever, chills, shortness of breath, chest pain, palpitations, leg swelling or tenderness, hemoptysis, rhinorrhea, nasal congestion, trismus, drooling, hot potato voice, neck pain, neck stiffness, abdominal pain, nausea, vomiting, diarrhea, lightheadedness, syncope. ? ? ?Sore Throat ?Pertinent negatives include no chest pain, no abdominal pain, no headaches and no shortness of breath.  ? ?  ? ?Home Medications ?Prior to Admission medications   ?Medication Sig Start Date End Date Taking? Authorizing Provider  ?cetirizine (ZYRTEC) 10 MG tablet Take 1 tablet (10 mg total) by mouth daily. 06/13/19   Nicolette Bang, MD  ?fluticasone Asencion Islam) 50 MCG/ACT nasal spray Place 2 sprays into both nostrils daily. 04/21/19   Charlott Rakes, MD  ?Multiple Vitamin (MULTIVITAMIN) tablet Take 1 tablet by mouth daily.    [provider]  ?Vitamin D, Ergocalciferol, (DRISDOL) 1.25 MG (50000 UNIT) CAPS capsule Take 1 capsule (50,000 Units total) by mouth every 7 (seven) days. 12/14/19   Scot Jun, FNP  ?   ? ?Allergies    ?Patient has no known allergies.   ? ?Review of Systems   ?Review of Systems  ?Constitutional:  Negative for chills  and fever.  ?HENT:  Positive for sore throat. Negative for congestion, drooling, rhinorrhea, trouble swallowing and voice change.   ?Eyes:  Negative for visual disturbance.  ?Respiratory:  Positive for cough. Negative for shortness of breath.   ?Cardiovascular:  Negative for chest pain, palpitations and leg swelling.  ?Gastrointestinal:  Negative for abdominal pain, nausea and vomiting.  ?Musculoskeletal:  Negative for back pain and neck pain.  ?Skin:  Negative for color change and rash.  ?Neurological:  Negative for dizziness, syncope, light-headedness and headaches.  ?Psychiatric/Behavioral:  Negative for confusion.   ? ?Physical Exam ?Updated Vital Signs ?BP 131/69 (BP Location: Right Arm)   Pulse 87   Temp 98.3 ?F (36.8 ?C)   Resp 17   Ht 5\' 1"  (1.549 m)   Wt 95.3 kg   LMP 07/01/2021 (Exact Date)   SpO2 96%   BMI 39.68 kg/m?  ?Physical Exam ?Vitals and nursing note reviewed.  ?Constitutional:   ?   General: She is not in acute distress. ?   Appearance: She is not ill-appearing, toxic-appearing or diaphoretic.  ?HENT:  ?   Head: Normocephalic.  ?   Jaw: There is normal jaw occlusion. No trismus, tenderness, swelling, pain on movement or malocclusion.  ?   Salivary Glands: Right salivary gland is not diffusely enlarged or tender. Left salivary gland is not diffusely enlarged or tender.  ?   Mouth/Throat:  ?   Lips: Pink. No lesions.  ?   Mouth: Mucous membranes are moist. No angioedema.  ?   Tongue: No lesions.  Tongue does not deviate from midline.  ?   Palate: No mass and lesions.  ?   Pharynx: Oropharynx is clear. Uvula midline. Posterior oropharyngeal erythema present. No pharyngeal swelling, oropharyngeal exudate or uvula swelling.  ?   Tonsils: Tonsillar exudate present. No tonsillar abscesses. 1+ on the right. 1+ on the left.  ?   Comments: Exudate noted to left tonsil.  Handles oral secretions without difficulty. ?Eyes:  ?   General: No scleral icterus.    ?   Right eye: No discharge.     ?   Left  eye: No discharge.  ?Neck:  ?   Comments: No swelling to submandibular space ?Cardiovascular:  ?   Rate and Rhythm: Normal rate.  ?Pulmonary:  ?   Effort: Pulmonary effort is normal. No tachypnea, bradypnea or respiratory distress.  ?   Breath sounds: Normal breath sounds. No stridor.  ?   Comments: Speaks in full complete sentences without difficulty ?Musculoskeletal:  ?   Cervical back: Full passive range of motion without pain, normal range of motion and neck supple. No edema, erythema, signs of trauma, rigidity, torticollis or crepitus. No pain with movement, spinous process tenderness or muscular tenderness. Normal range of motion.  ?Skin: ?   General: Skin is warm and dry.  ?Neurological:  ?   General: No focal deficit present.  ?   Mental Status: She is alert.  ?Psychiatric:     ?   Behavior: Behavior is cooperative.  ? ? ?ED Results / Procedures / Treatments   ?Labs ?(all labs ordered are listed, but only abnormal results are displayed) ?Labs Reviewed  ?RESP PANEL BY RT-PCR (FLU A&B, COVID) [id number]  ?GROUP A STREP BY PCR  ?CULTURE, GROUP A STREP [facility])  ? ? ?EKG ?None ? ?Radiology ?No results found. ? ?Procedures ?Procedures  ? ? ?Medications Ordered in ED ?Medications - No data to display ? ?ED Course/ Medical Decision Making/ A&P ?  ?                        ?Medical Decision Making ?Risk ?Prescription drug management. ? ? ?Alert 41 year old female no acute distress, nontoxic-appearing.  Presents to the emergency department with a chief complaint of sore throat and cough. ? ?Information obtained from patient and patient's daughter at bedside.  Past medical records were reviewed including previous provider notes, labs, and imaging.  Patient has medical history as outlined in HPI which complicates her care. ? ?Per chart review patient was seen on 07/03/2021 for similar complaints.  Patient was negative for strep pharyngitis, COVID-19, and influenza. ? ?Due to reports of sore throat will obtain strep PCR and  culture.  We will swab patient for COVID-19 and influenza. ? ?Patient has no swelling to tonsils bilaterally.  There is exudate noted to left tonsil however unclear if this is a tonsil stone.  Patient is able to tolerate p.o. intake without difficulty.  We will give patient viscous lidocaine to help with her sore throat. ? ?CT imaging of patient's neck was considered to evaluate for Ludewig's angina or deep space neck infection however low suspicion at this time as patient has no swelling to submandibular space, trismus, hot potato voice, pain with passive range of motion of neck. ? ?Patient's lungs are clear to auscultation bilaterally.  She is speaking in full complete sentences without difficulty and afebrile.  Low suspicion for pneumonia at this time. ? ?I personally viewed and interpreted patient's lab results.  Patient is negative for COVID-19 and influenza ? ?I was notified by patient's nurse tech that there was a complication with patient's strep PCR sample.  Sample had to be resent to the emergency department.  Shared decision making with patient about waiting for results versus discharge and follow-up with results on her [place].  Patient is agreeable with following her results on [facility] MyChart.  If patient is positive for strep pharyngitis I will prescribe antibiotics to treat this.  Will prescribe patient with course of viscous lidocaine.  Patient to follow-up with PCP if symptoms do not improve. ? ?Based on patient's chief complaint, I considered admission might be necessary, however after reassuring ED workup feel patient is reasonable for discharge.  Discussed results, findings, treatment and follow up. Patient advised of return precautions. Patient verbalized understanding and agreed with plan. ? ?Portions of this note were generated with [employer]. Dictation errors may occur despite best attempts at proofreading. ? ? ? ? ? ? ? ? ?Final Clinical Impression(s) / ED  Diagnoses ?Final diagnoses:  ?Sore throat  ?Acute cough  ? ? ?Rx / DC Orders ?ED Discharge Orders   ? ? None  ? ?  ? ? ?  ?Loni Beckwith, PA-C ?07/06/21 0004 ? ?  ?Charlesetta Shanks, MD ?07/08/21 1702 ? ?

## 2021-07-08 LAB — CULTURE, GROUP A STREP (THRC)

## 2022-04-15 IMAGING — CR DG CHEST 2V
1 series · 2 of 2 positions shown · non-contrast
Comparison: None.

CLINICAL DATA: Shortness of breath.

EXAM:
CHEST - 2 VIEW

[Series 1: dg chest 2 view · 0.14mm/px · 2 of 2 slices shown]
[im 1/2]
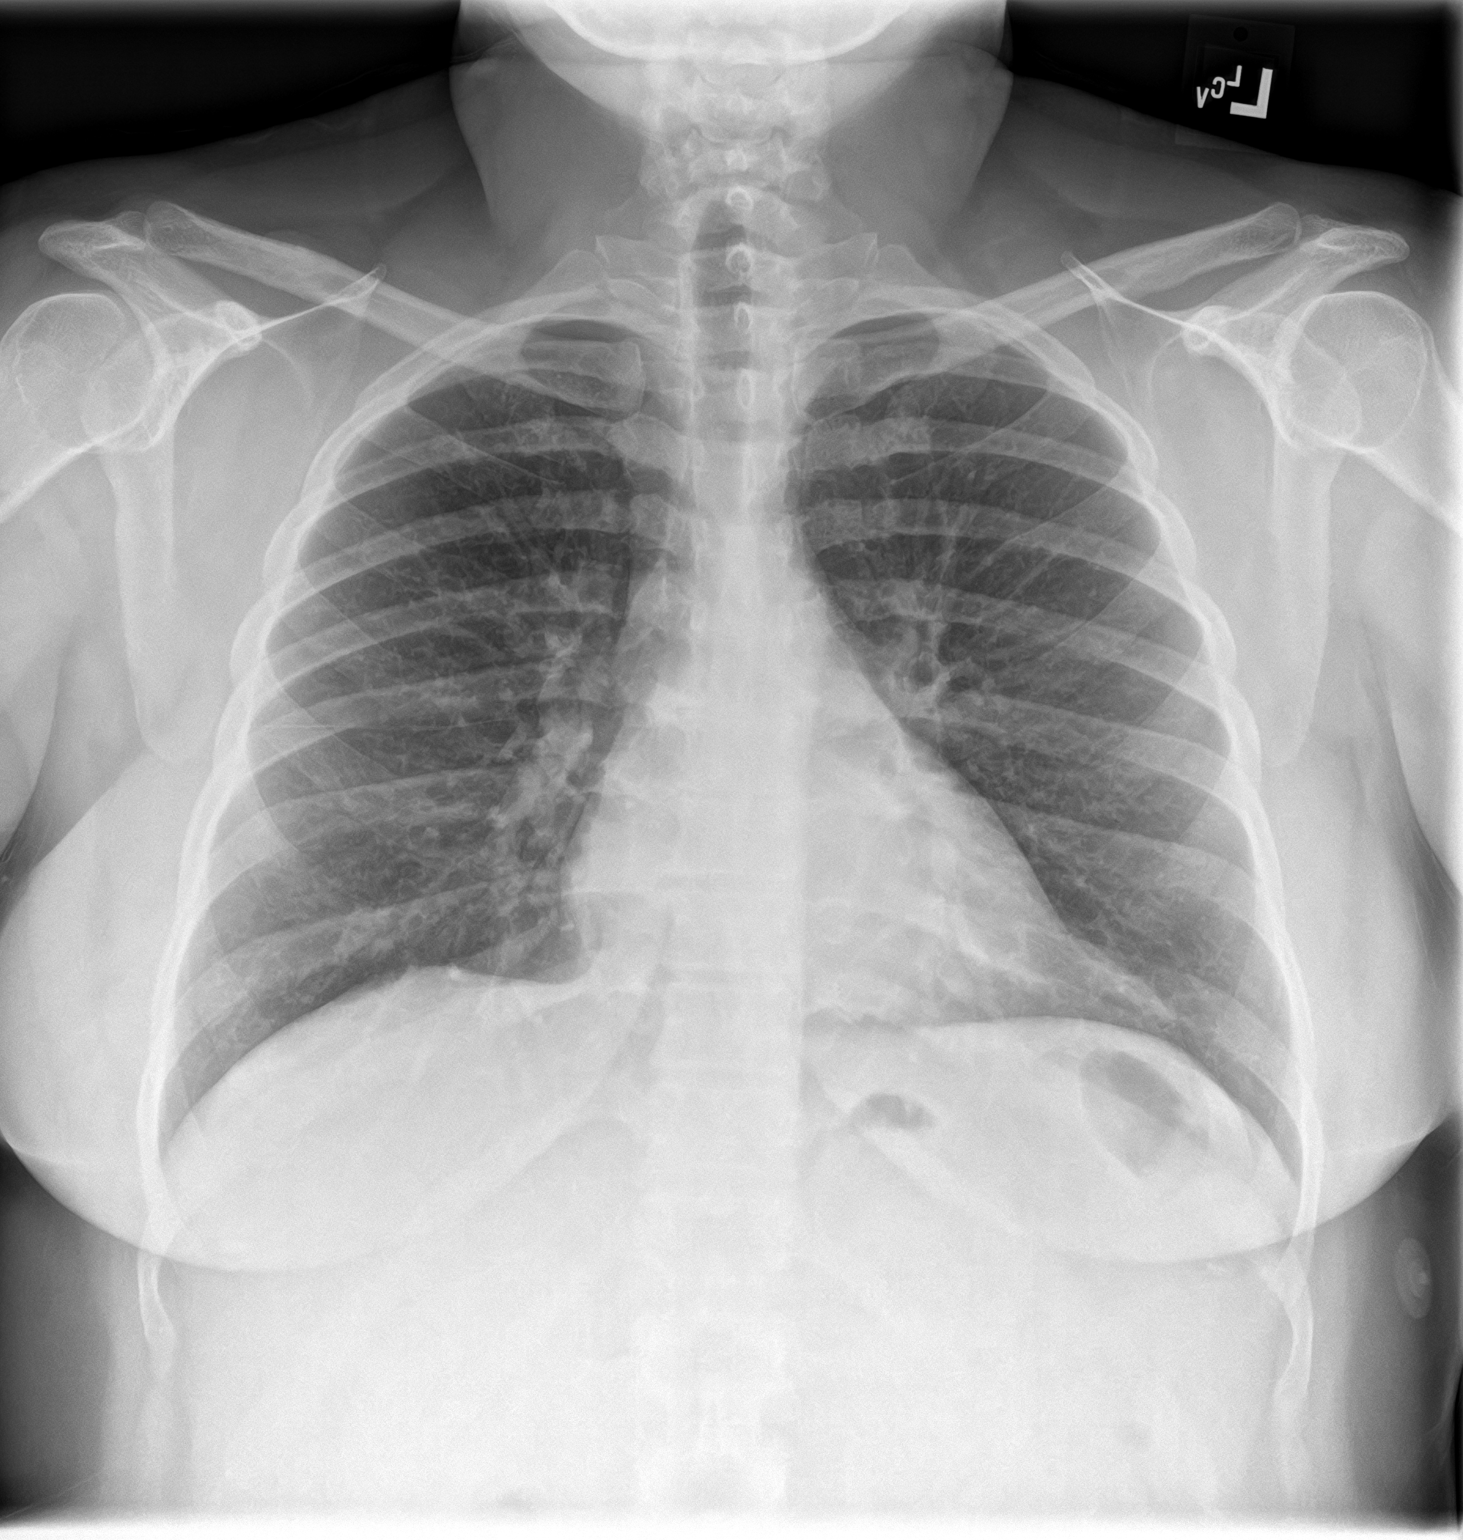
[im 2/2]
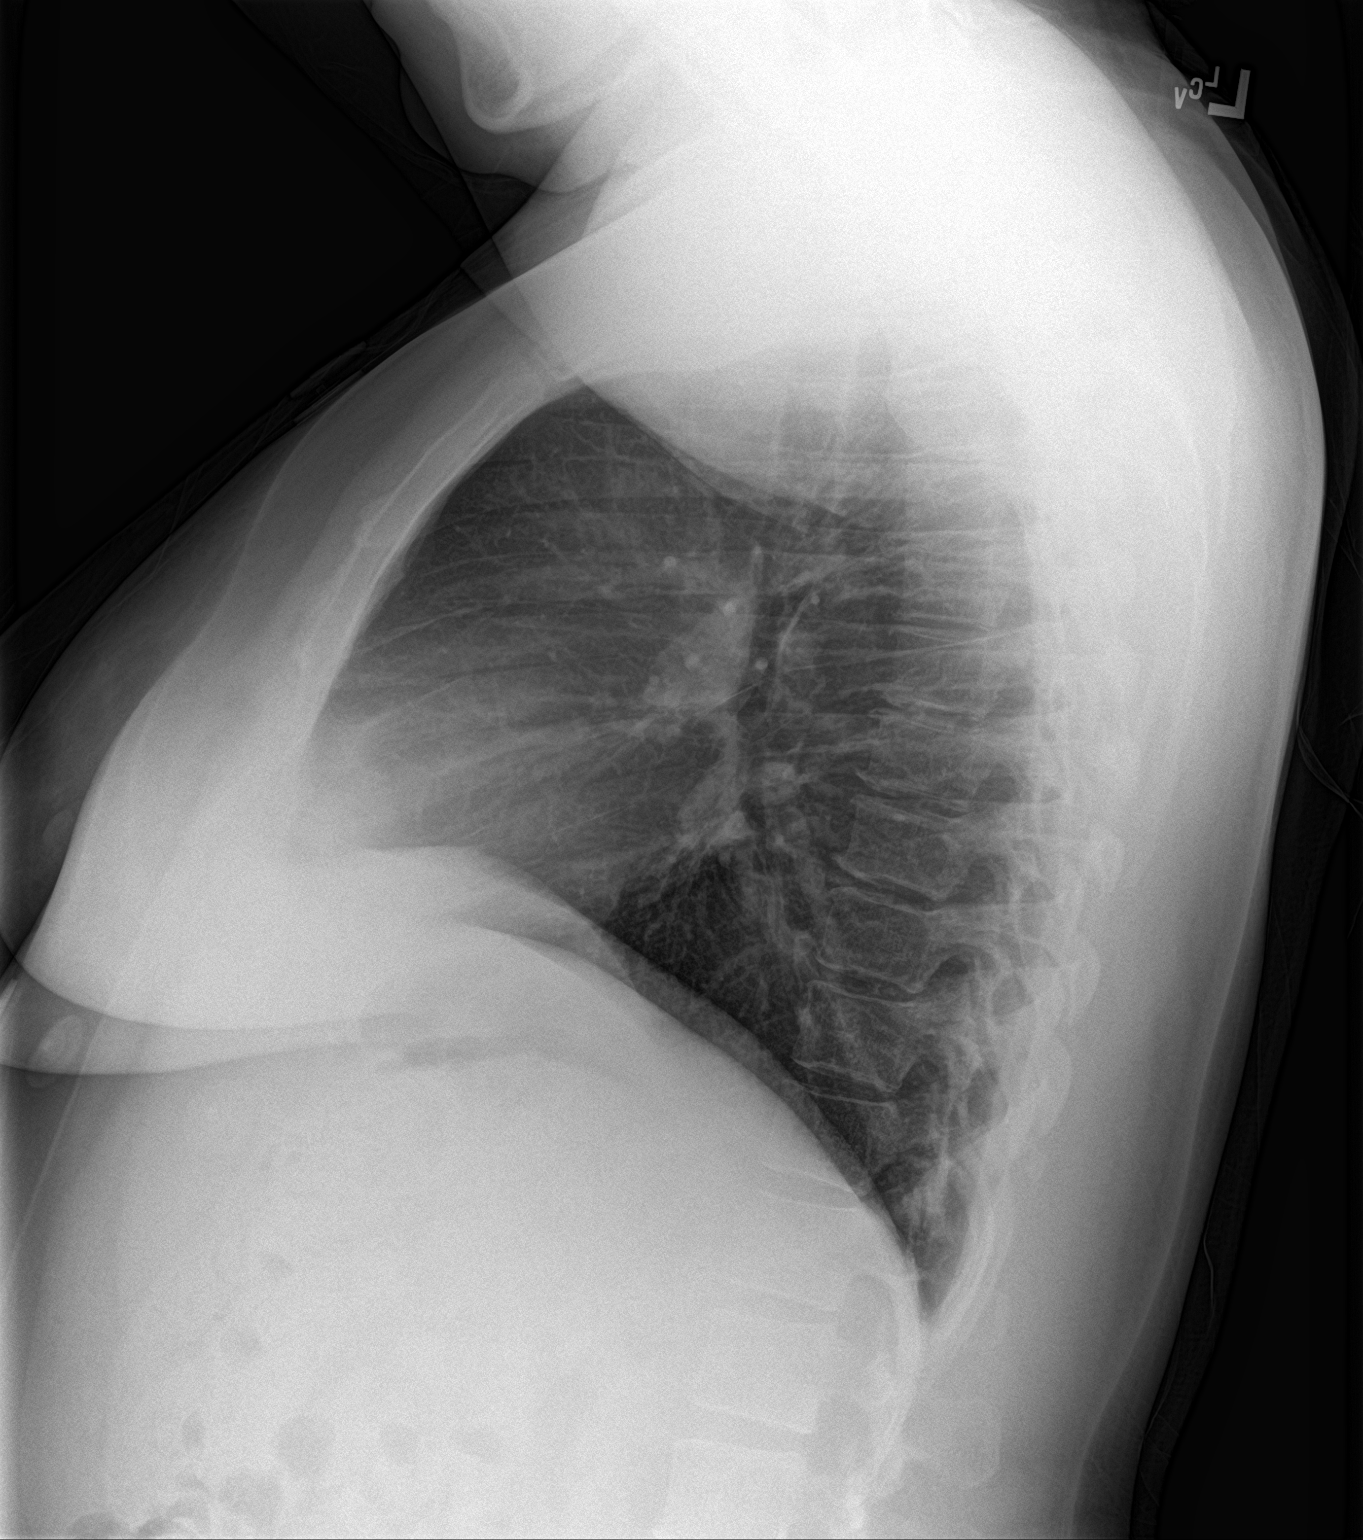

[2 of 2 positions shown; findings below may reference images not displayed]

FINDINGS: The heart size and mediastinal contours are within normal limits.
Both lungs are clear. The visualized skeletal structures are
unremarkable.
IMPRESSION: No active cardiopulmonary disease.
